# Patient Record
Sex: Female | Born: 1965 | Race: White | Marital: Single | Smoking: Never smoker
Health system: Southern US, Community
[De-identification: ages and names within clinical notes are randomized; demographics above are authoritative.]

## PROBLEM LIST (undated history)

## (undated) DIAGNOSIS — Z206 Contact with and (suspected) exposure to human immunodeficiency virus [HIV]: Secondary | ICD-10-CM

## (undated) DIAGNOSIS — T7840XA Allergy, unspecified, initial encounter: Secondary | ICD-10-CM

## (undated) DIAGNOSIS — K219 Gastro-esophageal reflux disease without esophagitis: Secondary | ICD-10-CM

## (undated) DIAGNOSIS — M1990 Unspecified osteoarthritis, unspecified site: Secondary | ICD-10-CM

## (undated) DIAGNOSIS — A6000 Herpesviral infection of urogenital system, unspecified: Secondary | ICD-10-CM

## (undated) HISTORY — PX: WISDOM TOOTH EXTRACTION: SHX21

## (undated) HISTORY — DX: Gastro-esophageal reflux disease without esophagitis: K21.9

## (undated) HISTORY — DX: Contact with and (suspected) exposure to human immunodeficiency virus (hiv): Z20.6

## (undated) HISTORY — DX: Unspecified osteoarthritis, unspecified site: M19.90

## (undated) HISTORY — PX: HIP SURGERY: SHX245

## (undated) HISTORY — PX: HEEL SPUR RESECTION: SHX6410

## (undated) HISTORY — PX: LASIK: SHX215

## (undated) HISTORY — PX: OTHER SURGICAL HISTORY: SHX169

## (undated) HISTORY — PX: KNEE ARTHROSCOPY: SUR90

## (undated) HISTORY — DX: Allergy, unspecified, initial encounter: T78.40XA

## (undated) HISTORY — DX: Herpesviral infection of urogenital system, unspecified: A60.00

---

## 1998-06-04 ENCOUNTER — Other Ambulatory Visit: Admission: RE | Admit: 1998-06-04 | Discharge: 1998-06-04 | Payer: Self-pay | Admitting: Obstetrics and Gynecology

## 1999-06-28 ENCOUNTER — Other Ambulatory Visit: Admission: RE | Admit: 1999-06-28 | Discharge: 1999-06-28 | Payer: Self-pay | Admitting: Obstetrics and Gynecology

## 1999-11-18 ENCOUNTER — Encounter: Payer: Self-pay | Admitting: Internal Medicine

## 1999-11-18 ENCOUNTER — Encounter: Admission: RE | Admit: 1999-11-18 | Discharge: 1999-11-18 | Payer: Self-pay | Admitting: Internal Medicine

## 2000-01-10 ENCOUNTER — Ambulatory Visit (HOSPITAL_COMMUNITY): Admission: RE | Admit: 2000-01-10 | Discharge: 2000-01-10 | Payer: Self-pay | Admitting: Orthopaedic Surgery

## 2000-06-28 ENCOUNTER — Other Ambulatory Visit: Admission: RE | Admit: 2000-06-28 | Discharge: 2000-06-28 | Payer: Self-pay | Admitting: Obstetrics and Gynecology

## 2001-06-12 ENCOUNTER — Ambulatory Visit (HOSPITAL_COMMUNITY): Admission: RE | Admit: 2001-06-12 | Discharge: 2001-06-12 | Payer: Self-pay | Admitting: Orthopaedic Surgery

## 2007-05-06 ENCOUNTER — Encounter: Admission: RE | Admit: 2007-05-06 | Discharge: 2007-05-06 | Payer: Self-pay | Admitting: Family Medicine

## 2008-04-16 LAB — CONVERTED CEMR LAB: Pap Smear: NORMAL

## 2008-05-14 ENCOUNTER — Ambulatory Visit (HOSPITAL_BASED_OUTPATIENT_CLINIC_OR_DEPARTMENT_OTHER): Admission: RE | Admit: 2008-05-14 | Discharge: 2008-05-14 | Payer: Self-pay | Admitting: Family Medicine

## 2009-01-06 ENCOUNTER — Ambulatory Visit: Payer: Self-pay | Admitting: Family Medicine

## 2009-01-06 ENCOUNTER — Encounter: Payer: Self-pay | Admitting: Family Medicine

## 2009-01-06 ENCOUNTER — Other Ambulatory Visit: Admission: RE | Admit: 2009-01-06 | Discharge: 2009-01-06 | Payer: Self-pay | Admitting: Family Medicine

## 2009-01-07 ENCOUNTER — Encounter: Payer: Self-pay | Admitting: Family Medicine

## 2009-01-07 DIAGNOSIS — R740 Nonspecific elevation of levels of transaminase and lactic acid dehydrogenase [LDH]: Secondary | ICD-10-CM

## 2009-01-07 LAB — CONVERTED CEMR LAB
ALT: 50 units/L — ABNORMAL HIGH (ref 0–35)
Alkaline Phosphatase: 90 units/L (ref 39–117)
BUN: 15 mg/dL (ref 6–23)
Chloride: 104 meq/L (ref 96–112)
Cholesterol: 166 mg/dL (ref 0–200)
Creatinine, Ser: 0.76 mg/dL (ref 0.40–1.20)
Glucose, Bld: 93 mg/dL (ref 70–99)
Hemoglobin: 14.6 g/dL (ref 12.0–15.0)
RBC: 4.6 M/uL (ref 3.87–5.11)
TSH: 1.754 microintl units/mL (ref 0.350–4.500)
Total Protein: 7.1 g/dL (ref 6.0–8.3)
Triglycerides: 38 mg/dL (ref <150)
VLDL: 8 mg/dL (ref 0–40)
WBC: 5.2 10*3/uL (ref 4.0–10.5)

## 2009-01-08 LAB — CONVERTED CEMR LAB: HCV Ab: NEGATIVE

## 2009-01-13 ENCOUNTER — Encounter: Payer: Self-pay | Admitting: Family Medicine

## 2009-01-14 LAB — CONVERTED CEMR LAB
ALT: 28 units/L (ref 0–35)
Albumin: 4.4 g/dL (ref 3.5–5.2)
Indirect Bilirubin: 0.5 mg/dL (ref 0.0–0.9)
Total Bilirubin: 0.7 mg/dL (ref 0.3–1.2)
Total Protein: 6.7 g/dL (ref 6.0–8.3)

## 2009-10-29 ENCOUNTER — Encounter: Admission: RE | Admit: 2009-10-29 | Discharge: 2009-11-26 | Payer: Self-pay | Admitting: Orthopaedic Surgery

## 2009-12-21 ENCOUNTER — Encounter: Admission: RE | Admit: 2009-12-21 | Discharge: 2009-12-21 | Payer: Self-pay | Admitting: Family Medicine

## 2010-03-22 ENCOUNTER — Ambulatory Visit: Payer: Self-pay | Admitting: Family Medicine

## 2010-03-22 DIAGNOSIS — H9209 Otalgia, unspecified ear: Secondary | ICD-10-CM | POA: Insufficient documentation

## 2010-06-03 ENCOUNTER — Ambulatory Visit: Payer: Self-pay | Admitting: Family Medicine

## 2010-06-03 DIAGNOSIS — K649 Unspecified hemorrhoids: Secondary | ICD-10-CM | POA: Insufficient documentation

## 2010-06-03 LAB — CONVERTED CEMR LAB
HDL goal, serum: 40 mg/dL
LDL Goal: 160 mg/dL

## 2010-06-05 LAB — CONVERTED CEMR LAB: TSH: 1.63 microintl units/mL (ref 0.350–4.500)

## 2010-08-18 NOTE — Assessment & Plan Note (Signed)
Summary: Otalgia   Vital Signs:  Patient profile:   45 year old female Menstrual status:  regular Height:      65 inches Weight:      158 pounds BMI:     26.39 Temp:     98.6 degrees F oral Pulse rate:   114 / minute BP sitting:   121 / 72  (left arm) Cuff size:   regular  Vitals Entered By: Avon Gully CMA, Duncan Dull) (March 22, 2010 2:05 PM) CC: Ear pain and dizziness   Primary Care G[facility]ent[facility]:  Nani Gasser, MD  CC:  Ear pain and dizziness.  History of Present Illness: Left ear pain for one week. Takes Allegra every other day for allergies. Last couple o fdays has felt dizzy when first gets up in the morning.  No true vertigo. No syncope or lightheadedness. No ear drianage. Some pressure. No fever.  No ST.  Sneezing and post nasal drip.    Current Medications (verified): 1)  Valacyclovir Hcl 500 Mg Tabs (Valacyclovir Hcl) .Marland Kitchen.. 1 Tab By Mouth Daily 2)  Omeprazole 20 Mg Cpdr (Omeprazole) .Marland Kitchen.. 1 Capsule By Mouth Every Other Day 3)  Fexofenadine Hcl 180 Mg Tabs (Fexofenadine Hcl) .Marland Kitchen.. 1 Tab By Mouth Every Other Day 4)  Alli  Allergies (verified): No Known Drug Allergies  Comments:  Nurse/Medical Assistant: The patient's medications and allergies were reviewed with the patient and were updated in the Medication and Allergy Lists. Avon Gully CMA, Duncan Dull) (March 22, 2010 2:06 PM)  Physical Exam  General:  Well-developed,well-nourished,in no acute distress; alert,appropriate and cooperative throughout examination Head:  Normocephalic and atraumatic without obvious abnormalities. No apparent alopecia or balding. Eyes:  No corneal or conjunctival inflammation noted. EOMI. Perrla. l. Ears:  External ear exam shows no significant lesions or deformities.  Otoscopic examination reveals clear canals, tympanic membranes are intact bilaterally without bulging, retraction, inflammation or discharge. Hearing is grossly normal bilaterally. Nose:  External nasal  examination shows no deformity or inflammation. Nasal mucosa are pink and moist without lesions or exudates. Mouth:  Oral mucosa and oropharynx without lesions or exudates.  Teeth in good repair. Neck:  No deformities, masses, or tenderness noted. Lungs:  Normal respiratory effort, chest expands symmetrically. Lungs are clear to auscultation, no crackles or wheezes. Heart:  Normal rate and regular rhythm. S1 and S2 normal without gallop, murmur, click, rub or other extra sounds. Skin:  no rashes.   Cervical Nodes:  No lymphadenopathy noted Psych:  Cognition and judgment appear intact. Alert and cooperative with normal attention span and concentration. No apparent delusions, illusions, hallucinations   Impression & Recommendations:  Problem # 1:  OTALGIA (ICD-388.70)  Discussed symptom control. Will increase her allergoy regimen.  Increase allegra to daily and start the asteprodialy. If not better in one week then let me know or if the dizziness Samples of astepro given. REviewed how to use it. Nasal steroid cause nosebleeds.   Complete Medication List: 1)  Valacyclovir Hcl 500 Mg Tabs (Valacyclovir hcl) .Marland Kitchen.. 1 tab by mouth daily 2)  Omeprazole 20 Mg Cpdr (Omeprazole) .Marland Kitchen.. 1 capsule by mouth every other day 3)  Fexofenadine Hcl 180 Mg Tabs (Fexofenadine hcl) .Marland Kitchen.. 1 tab by mouth every other day 4)  Alli   Patient Instructions: 1)  Trial of astepo - one spray in each nostril two times a day and increase Allegra to daily. 2)  If not better in one week then call please.

## 2010-08-18 NOTE — Assessment & Plan Note (Signed)
Summary:  Hemmoroids   Vital Signs:  Patient profile:   45 year old female Menstrual status:  regular Height:      65 inches Weight:      165 pounds Pulse rate:   71 / minute BP sitting:   128 / 75  (right arm) Cuff size:   regular  Vitals Entered By: Avon Gully CMA, Duncan Dull) (June 03, 2010 3:36 PM) CC: possible hemmrhoids, straining with BM and could see something in the rectum, Lipid Management   Primary Care Provider:  Nani Gasser, MD  CC:  possible hemmrhoids, straining with BM and could see something in the rectum, and Lipid Management.  History of Present Illness: possible hemmrhoids, straining with BM and could see something in the rectum.  No external pain or itching.  Still having one BM a day but does have to strain.  Gets BRB on the tissue.  Wehn bear down can see tissued. She has gained some weight but overall feels her diet is healhty with fruits and veggies. + family hx of thyroid d/o.   Lipid Management History:      Negative NCEP/ATP III risk factors include female age less than 55 years old, HDL cholesterol greater than 60, and non-tobacco-user status.    Current Medications (verified): 1)  Valacyclovir Hcl 500 Mg Tabs (Valacyclovir Hcl) .Marland Kitchen.. 1 Tab By Mouth Daily 2)  Omeprazole 20 Mg Cpdr (Omeprazole) .Marland Kitchen.. 1 Capsule By Mouth Every Other Day 3)  Fexofenadine Hcl 180 Mg Tabs (Fexofenadine Hcl) .Marland Kitchen.. 1 Tab By Mouth Every Other Day 4)  Alli  Allergies (verified): No Known Drug Allergies  Comments:  Nurse/Medical Assistant: The patient's medications and allergies were reviewed with the patient and were updated in the Medication and Allergy Lists. 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  BS in Biology.  Married to [employer] with one Museum/gallery conservator.  Husband has hx of prostate Cancer. First husband died, HIV.   Never Smoked Alcohol use-yes Drug use-no Regular exercise-yes, tennis 3 x a  week  Physical Exam  General:  Well-developed,well-nourished,in no acute distress; alert,appropriate and cooperative throughout examination Rectal:  Small noninflammed internal hemorrhoid at the 6 o'clock position. She has a small fissure at the 12 o'clock position.     Impression & Recommendations:  Problem # 1:  HEMORRHOIDS (ICD-455.6) Discussed dx. Dsicussed working on softening the stools by increasing her fiber. She feels she does a good job with her diet so recommen OTC fiber supplement. Call if any flares or pain.   Problem # 2:  CONSTIPATION (ICD-564.00) Dsicussed working on softening the stools by increasing her fiber. She feels she does a good job with her diet so recommen OTC fiber supplement. Can add a stool softener if getting too much gas. With her family hx  and recent change and weight gain I wll check her Thyroid level.   Orders: T-TSH ([id number])  Complete Medication List: 1)  Valacyclovir Hcl 500 Mg Tabs (Valacyclovir hcl) .Marland Kitchen.. 1 tab by mouth daily 2)  Omeprazole 20 Mg Cpdr (Omeprazole) .Marland Kitchen.. 1 capsule by mouth every other day 3)  Fexofenadine Hcl 180 Mg Tabs (Fexofenadine hcl) .Marland Kitchen.. 1 tab by mouth every other day 4)  Alli   Lipid Assessment/Plan:      Based on NCEP/ATP III, the patient's risk factor category is "0-1 risk factors".  The patient's lipid goals are as follows: Total cholesterol goal is 200; LDL cholesterol goal is 160; HDL  cholesterol goal is 40; Triglyceride goal is 150.     Contraindications/Deferment of Procedures/Staging:    Test/Procedure: FLU VAX    Reason for deferment: patient declined   Patient Instructions: 1)  INcrease the fiber in your diet wtih benefiber or Metamucil. If causing too much gas can add a stool softener like colace.  2)  Make sure drinking plenty of water.  3)  We will calll you with your lab result.    Orders Added: 1)  T-TSH [[id number]] 2)  Est. Patient Level III [78295]

## 2010-12-02 NOTE — Op Note (Signed)
[place]  Patient:    Andrea Tran, Andrea Tran Visit Number: [id number] MRN: [id number]          Service Type: DSU Location: [facility] 2550 03 Attending Physician:  Jacki Cones Dictated by:   Veverly Fells Ophelia Charter, M.D. Proc. Date: 06/12/01 Admit Date:  06/12/2001                             Operative Report  PREOPERATIVE DIAGNOSIS:  Left snapping hip.  POSTOPERATIVE DIAGNOSIS:  Left snapping hip.  PROCEDURE:  Left hip arthroscopy and debridement of anterior labral tear.  SURGEON:  Mark C. Ophelia Charter, M.D.  ASSISTANT:  Nadara Mustard, M.D.  ANESTHESIA:  General.  PROCEDURE:  After induction of general anesthesia, the patient was placed in the lateral position over a peroneal post with the right side down and the left foot in traction.  Careful padding, axillary roll, and positioning was performed.  DuraPrep was used.  The area was secured with towels, and a large Betadine Vi-Drape was applied.  The patient was then visualized under fluoroscopy.  The trochanter was marked with a sterile skin marker. Initially, a posterolateral portal was attempted.  Position looked good under fluoroscopy; however, there was poor visualization with the arthroscope. Inflow looked good.  The scope was dry, and some water was added to help with visualization, and this initially gave satisfactory images for a few seconds, and then again, there was poor visualization.  Outflow as established through an anterolateral portal, and there was still poor visualization.  Anterior portal was made after adding some additional traction staying lateral to the neurovascular bundle.  Arthroscope was introduced from the anterior portal and inspection of the scope showed that there was some scratching in the scope, and a new scope was obtained which held the water, and excellent visualization was obtained.  The fovea was visualized.  There was no chondromalacia of the head.  Inspection of the  anterior portion of the labrum showed some scar tissue present in the anterior labrum which did not visualize on the MR arthrogram.  There was a tear of the anterior labrum, and this was debrided back with the 4.2  _______ shaver switching from the anterolateral portal to the anterior portal and also the posterolateral portal.  Once this tissue was debrided, there was excellent visualization of the joint.  The hip was rotated, and no chondral lesions were seen on the head.  The joint was suctioned dry.  Marcaine had been added to all portals, and postop dressing was applied after nylon simple skin sutures were placed at the portals.  The patient tolerated the procedure well and was transferred to the recovery room and then home. Dictated by:   Veverly Fells Ophelia Charter, M.D. Attending Physician:  Jacki Cones DD:  06/13/01 TD:  06/13/01 Job: [id number]

## 2011-02-07 ENCOUNTER — Other Ambulatory Visit: Payer: Self-pay | Admitting: Family Medicine

## 2011-02-14 ENCOUNTER — Other Ambulatory Visit: Payer: Self-pay | Admitting: Family Medicine

## 2011-02-14 MED ORDER — VALACYCLOVIR HCL 500 MG PO TABS: 500.0000 mg | ORAL_TABLET | Freq: Every day | ORAL | Status: DC

## 2011-02-14 NOTE — Telephone Encounter (Signed)
Pt called  Inquiring about her valtrex script for genital herpes. She says the pharm is saying they do not have the script. Plan:  Reviewed the pt chart, and the script was sent on 02-07-11.  The pharm was correct in the chart.  When the pharmacist was called the script was obviously kicked out because it did not have directions.  Rx was re- sent with directions and the pt was informed. Jarvis Newcomer, LPN [facility]

## 2011-05-14 ENCOUNTER — Encounter: Payer: Self-pay | Admitting: Family Medicine

## 2011-05-19 ENCOUNTER — Encounter: Payer: Self-pay | Admitting: Family Medicine

## 2011-05-19 ENCOUNTER — Ambulatory Visit (INDEPENDENT_AMBULATORY_CARE_PROVIDER_SITE_OTHER): Payer: BC Managed Care – PPO | Admitting: Family Medicine

## 2011-05-19 ENCOUNTER — Other Ambulatory Visit (HOSPITAL_COMMUNITY)
Admission: RE | Admit: 2011-05-19 | Discharge: 2011-05-19 | Disposition: A | Discharge status: Home / Self Care | Payer: BC Managed Care – PPO | Source: Ambulatory Visit | Attending: Family Medicine | Admitting: Family Medicine

## 2011-05-19 VITALS — BP 116/76 | HR 69 | Wt 166.0 lb

## 2011-05-19 DIAGNOSIS — Z01419 Encounter for gynecological examination (general) (routine) without abnormal findings: Secondary | ICD-10-CM | POA: Insufficient documentation

## 2011-05-19 DIAGNOSIS — Z1159 Encounter for screening for other viral diseases: Secondary | ICD-10-CM | POA: Insufficient documentation

## 2011-05-19 MED ORDER — VALACYCLOVIR HCL 500 MG PO TABS: 500.0000 mg | ORAL_TABLET | Freq: Every day | ORAL | Status: DC

## 2011-05-19 NOTE — Progress Notes (Signed)
  Subjective:     Andrea Tran is a 45 y.o. female and is here for a comprehensive physical exam. The patient reports no problems.  History   Social History  . Marital Status: Married    Spouse Name: Ed    Number of Children: 0   . Years of Education: N/A   Occupational History  . Sales Assot    Social History Main Topics  . Smoking status: Never Smoker   . Smokeless tobacco: Not on file  . Alcohol Use: 0.0 - 0.5 oz/week    0-1 drink(s) per week  . Drug Use: No  . 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   Other Topics Concern  . Not on file   Social History Narrative   2 caffeine per day. Plays tennis 2-3 x per week.    Health Maintenance  Topic Date Due  . Influenza Vaccine  04/16/2012  . Pap Smear  05/18/2014  . Tetanus/tdap  01/07/2019    The following portions of the patient's history were reviewed and updated as appropriate: allergies, current medications, past family history, past medical history, past social history, past surgical history and problem list.  Review of Systems A comprehensive review of systems was negative.   Objective:    BP 116/76  Pulse 69  Wt 166 lb (75.297 kg)  LMP 05/06/2011 General appearance: alert, cooperative and appears stated age Head: Normocephalic, without obvious abnormality, atraumatic Eyes: conjunctiva clear, EOMi, PEERLA Ears: normal TM's and external ear canals both ears Nose: Nares normal. Septum midline. Mucosa normal. No drainage or sinus tenderness. Throat: lips, mucosa, and tongue normal; teeth and gums normal Neck: no adenopathy, no carotid bruit, supple, symmetrical, trachea midline and thyroid not enlarged, symmetric, no tenderness/mass/nodules Back: symmetric, no curvature. ROM normal. No CVA tenderness. Lungs: clear to auscultation bilaterally Breasts: normal appearance, no masses or tenderness Heart:  regular rate and rhythm, S1, S2 normal, no murmur, click, rub or gallop Abdomen: soft, non-tender; bowel sounds normal; no masses,  no organomegaly Pelvic: cervix normal in appearance, external genitalia normal, no adnexal masses or tenderness, no cervical motion tenderness, rectovaginal septum normal, uterus normal size, shape, and consistency and vagina normal without discharge Extremities: extremities normal, atraumatic, no cyanosis or edema Pulses: 2+ and symmetric Skin: Skin color, texture, turgor normal. No rashes or lesions Lymph nodes: Cervical, supraclavicular, and axillary nodes normal. Neurologic: Grossly normal    Assessment:    Healthy female exam.  Plan:     See After Visit Summary for Counseling Recommendations  She plans to schedule her mammogram Keep up the regular exercise program and make sure you are eating a healthy diet Try to eat 4 servings of dairy a day or take a calcium supplement (500mg  twice a day). Your vaccines are up to date.  Labs last year were normal. Repeat again next year.   Will call with pap results.  Strong fam hx of thyroid dz but she doesn't want testing this year. Her periods have been a little heavier this year, still regular. She is thinking about birth control. If wants to do this just call the office. I did discuss inc risk of blood clots since over age 47.

## 2011-05-19 NOTE — Patient Instructions (Signed)
She plans to schedule her mammogram Keep up the regular exercise program and make sure you are eating a healthy diet Try to eat 4 servings of dairy a day or take a calcium supplement (500mg  twice a day). Your vaccines are up to date.

## 2011-08-18 ENCOUNTER — Other Ambulatory Visit: Payer: Self-pay | Admitting: Family Medicine

## 2012-01-26 ENCOUNTER — Ambulatory Visit: Payer: BC Managed Care – PPO | Admitting: Physical Therapy

## 2012-01-29 ENCOUNTER — Ambulatory Visit: Payer: BC Managed Care – PPO | Attending: Podiatry | Admitting: Physical Therapy

## 2012-01-29 DIAGNOSIS — IMO0001 Reserved for inherently not codable concepts without codable children: Secondary | ICD-10-CM | POA: Insufficient documentation

## 2012-01-29 DIAGNOSIS — R269 Unspecified abnormalities of gait and mobility: Secondary | ICD-10-CM | POA: Insufficient documentation

## 2012-11-20 ENCOUNTER — Other Ambulatory Visit: Payer: Self-pay | Admitting: Family Medicine

## 2012-11-20 MED ORDER — VALACYCLOVIR HCL 500 MG PO TABS: 500.0000 mg | ORAL_TABLET | Freq: Every day | ORAL | Status: DC

## 2013-01-24 ENCOUNTER — Other Ambulatory Visit: Payer: Self-pay | Admitting: *Deleted

## 2013-01-24 DIAGNOSIS — Z0000 Encounter for general adult medical examination without abnormal findings: Secondary | ICD-10-CM

## 2013-02-03 ENCOUNTER — Other Ambulatory Visit: Payer: Self-pay | Admitting: Family Medicine

## 2013-02-03 LAB — LIPID PANEL
LDL Cholesterol: 75 mg/dL (ref 0–99)
Total CHOL/HDL Ratio: 2.4 Ratio
Triglycerides: 37 mg/dL (ref <150)

## 2013-02-03 LAB — COMPLETE METABOLIC PANEL WITH GFR
ALT: 13 U/L (ref 0–35)
AST: 14 U/L (ref 0–37)
Alkaline Phosphatase: 71 U/L (ref 39–117)
BUN: 13 mg/dL (ref 6–23)
CO2: 29 mEq/L (ref 19–32)
Calcium: 9.3 mg/dL (ref 8.4–10.5)
GFR, Est African American: >89 mL/min
GFR, Est Non African American: >89 mL/min
Glucose, Bld: 96 mg/dL (ref 70–99)
Total Bilirubin: 0.4 mg/dL (ref 0.3–1.2)

## 2013-02-03 LAB — TSH: TSH: 2.341 u[IU]/mL (ref 0.350–4.500)

## 2013-02-10 ENCOUNTER — Ambulatory Visit (INDEPENDENT_AMBULATORY_CARE_PROVIDER_SITE_OTHER): Payer: BC Managed Care – PPO | Admitting: Family Medicine

## 2013-02-10 ENCOUNTER — Encounter: Payer: Self-pay | Admitting: Family Medicine

## 2013-02-10 VITALS — BP 108/67 | HR 71 | Wt 152.0 lb

## 2013-02-10 DIAGNOSIS — Z0000 Encounter for general adult medical examination without abnormal findings: Secondary | ICD-10-CM

## 2013-02-10 DIAGNOSIS — A6000 Herpesviral infection of urogenital system, unspecified: Secondary | ICD-10-CM | POA: Insufficient documentation

## 2013-02-10 MED ORDER — VALACYCLOVIR HCL 500 MG PO TABS: 500.0000 mg | ORAL_TABLET | Freq: Every day | ORAL | Status: DC

## 2013-02-10 NOTE — Progress Notes (Signed)
  Subjective:     Andrea Tran is a 47 y.o. female and is here for a comprehensive physical exam. The patient reports no problems.  History   Social History  . Marital Status: Married    Spouse Name: [name]    Number of Children: 0   . Years of Education: N/A   Occupational History  . Sales Assot    Social History Main Topics  . Smoking status: Never Smoker   . Smokeless tobacco: Not on file  . Alcohol Use: 0 - .5 oz/week    0-1 drink(s) per week  . Drug Use: No  . 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   Other Topics Concern  . Not on file   Social History Narrative   2 caffeine per day. Plays tennis 2-3 x per week.    Health Maintenance  Topic Date Due  . Influenza Vaccine  03/17/2013  . Pap Smear  05/18/2014  . Tetanus/tdap  01/07/2019    The following portions of the patient's history were reviewed and updated as appropriate: allergies, current medications, past family history, past medical history, past social history, past surgical history and problem list.  Review of Systems A comprehensive review of systems was negative.   Objective:    BP 108/67  Pulse 71  Wt 152 lb (68.947 kg)  BMI 25.29 kg/m2 General appearance: alert, cooperative and appears stated age Head: Normocephalic, without obvious abnormality, atraumatic Eyes: conj clear, EOMi, PEERLA Ears: normal TM's and external ear canals both ears Nose: Nares normal. Septum midline. Mucosa normal. No drainage or sinus tenderness. Throat: lips, mucosa, and tongue normal; teeth and gums normal Neck: no adenopathy, no carotid bruit, no JVD, supple, symmetrical, trachea midline and thyroid not enlarged, symmetric, no tenderness/mass/nodules Back: symmetric, no curvature. ROM normal. No CVA tenderness. Lungs: clear to auscultation bilaterally Breasts: normal appearance, no masses or  tenderness Heart: regular rate and rhythm, S1, S2 normal, no murmur, click, rub or gallop Abdomen: soft, non-tender; bowel sounds normal; no masses,  no organomegaly Extremities: extremities normal, atraumatic, no cyanosis or edema Pulses: 2+ and symmetric Skin: Skin color, texture, turgor normal. No rashes or lesions Lymph nodes: Cervical, supraclavicular, and axillary nodes normal. Neurologic: Alert and oriented X 3, normal strength and tone. Normal symmetric reflexes. Normal coordination and gait    Assessment:    Healthy female exam.      Plan:     See After Visit Summary for Counseling Recommendations  Keep up a regular exercise program and make sure you are eating a healthy diet Try to eat 4 servings of dairy a day, or if you are lactose intolerant take a calcium with vitamin D daily.  Your vaccines are up to date.    Refill valtrex.

## 2014-02-02 ENCOUNTER — Other Ambulatory Visit: Payer: Self-pay

## 2014-02-02 ENCOUNTER — Other Ambulatory Visit: Payer: Self-pay | Admitting: Family Medicine

## 2014-02-02 MED ORDER — VALACYCLOVIR HCL 500 MG PO TABS: 500.0000 mg | ORAL_TABLET | Freq: Every day | ORAL | Status: DC

## 2014-02-26 ENCOUNTER — Ambulatory Visit (INDEPENDENT_AMBULATORY_CARE_PROVIDER_SITE_OTHER): Payer: BC Managed Care – PPO | Admitting: Family Medicine

## 2014-02-26 ENCOUNTER — Ambulatory Visit (INDEPENDENT_AMBULATORY_CARE_PROVIDER_SITE_OTHER): Payer: BC Managed Care – PPO

## 2014-02-26 ENCOUNTER — Encounter: Payer: Self-pay | Admitting: Family Medicine

## 2014-02-26 ENCOUNTER — Other Ambulatory Visit (HOSPITAL_COMMUNITY)
Admission: RE | Admit: 2014-02-26 | Discharge: 2014-02-26 | Disposition: A | Discharge status: Home / Self Care | Payer: BC Managed Care – PPO | Source: Ambulatory Visit | Attending: Family Medicine | Admitting: Family Medicine

## 2014-02-26 VITALS — BP 109/70 | HR 71 | Ht 65.0 in | Wt 169.0 lb

## 2014-02-26 DIAGNOSIS — Z0000 Encounter for general adult medical examination without abnormal findings: Secondary | ICD-10-CM | POA: Diagnosis not present

## 2014-02-26 DIAGNOSIS — M79609 Pain in unspecified limb: Secondary | ICD-10-CM

## 2014-02-26 DIAGNOSIS — Z202 Contact with and (suspected) exposure to infections with a predominantly sexual mode of transmission: Secondary | ICD-10-CM

## 2014-02-26 DIAGNOSIS — M79672 Pain in left foot: Secondary | ICD-10-CM

## 2014-02-26 DIAGNOSIS — L821 Other seborrheic keratosis: Secondary | ICD-10-CM | POA: Diagnosis not present

## 2014-02-26 DIAGNOSIS — Z01419 Encounter for gynecological examination (general) (routine) without abnormal findings: Secondary | ICD-10-CM | POA: Insufficient documentation

## 2014-02-26 DIAGNOSIS — Z1231 Encounter for screening mammogram for malignant neoplasm of breast: Secondary | ICD-10-CM

## 2014-02-26 DIAGNOSIS — L659 Nonscarring hair loss, unspecified: Secondary | ICD-10-CM

## 2014-02-26 DIAGNOSIS — Z124 Encounter for screening for malignant neoplasm of cervix: Secondary | ICD-10-CM

## 2014-02-26 LAB — COMPLETE METABOLIC PANEL WITH GFR
ALBUMIN: 4 g/dL (ref 3.5–5.2)
ALK PHOS: 69 U/L (ref 39–117)
ALT: 13 U/L (ref 0–35)
AST: 13 U/L (ref 0–37)
BUN: 13 mg/dL (ref 6–23)
CHLORIDE: 104 meq/L (ref 96–112)
CO2: 25 meq/L (ref 19–32)
Calcium: 8.7 mg/dL (ref 8.4–10.5)
Creat: 0.7 mg/dL (ref 0.50–1.10)
GFR, Est African American: >89 mL/min
GLUCOSE: 88 mg/dL (ref 70–99)
Potassium: 4.3 mEq/L (ref 3.5–5.3)
SODIUM: 137 meq/L (ref 135–145)
TOTAL PROTEIN: 6.2 g/dL (ref 6.0–8.3)
Total Bilirubin: 0.8 mg/dL (ref 0.2–1.2)

## 2014-02-26 LAB — LIPID PANEL
CHOL/HDL RATIO: 2.7 ratio
CHOLESTEROL: 157 mg/dL (ref 0–200)
HDL: 59 mg/dL (ref >39)
LDL Cholesterol: 90 mg/dL (ref 0–99)
TRIGLYCERIDES: 39 mg/dL (ref <150)
VLDL: 8 mg/dL (ref 0–40)

## 2014-02-26 MED ORDER — VALACYCLOVIR HCL 500 MG PO TABS: ORAL_TABLET | ORAL | Status: DC

## 2014-02-26 NOTE — Progress Notes (Signed)
Subjective:     Andrea Tran is a 48 y.o. female and is here for a comprehensive physical exam. The patient reports problems - Pain on the bottom of the lef foot x 3 weeks.  over ball of the foot. no trauma or injury. Using aleve occ for relief and helps some.  plays tennis. Had a sterss freacture years ago.. She also complains of hair loss. She's as of the last couple months she's been noticing she's having a lot more hair coming out in the shower and when she just wants her fingers to her hair. She otherwise feels like her hair is healthy as is coming out more. She does not use any type of hair dyes or relaxing products. The hair loss is generalized and not a discreet spots were locations. She denies any scalp problems such as itchiness etc.  She also has a couple of lesions on her left shin. She would like them frozen off today as they're catching on her razor and bleeding intermittently.  History   Social History  . Marital Status: Married    Spouse Name: Ed    Number of Children: 0   . Years of Education: N/A   Occupational History  . Sales Assot    Social History Main Topics  . Smoking status: Never Smoker   . Smokeless tobacco: Not on file  . Alcohol Use: 0.0 - 0.5 oz/week    0-1 drink(s) per week  . Drug Use: No  . 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   Other Topics Concern  . Not on file   Social History Narrative   1 caffeine per day. Plays tennis 2-3 x per week. On step duaghter   Health Maintenance  Topic Date Due  . Influenza Vaccine  02/14/2014  . Pap Smear  05/18/2014  . Tetanus/tdap  01/07/2019    The following portions of the patient's history were reviewed and updated as appropriate: allergies, current medications, past family history, past medical history, past social history, past surgical history and problem list.  Review of Systems A  comprehensive review of systems was negative.   Objective:    BP 109/70  Pulse 71  Ht 5\' 5"  (1.651 m)  Wt 169 lb (76.658 kg)  BMI 28.12 kg/m2 General appearance: alert, cooperative and appears stated age Head: Normocephalic, without obvious abnormality, atraumatic Eyes: conj clear, EOMi, PEERLA Ears: normal TM's and external ear canals both ears Nose: Nares normal. Septum midline. Mucosa normal. No drainage or sinus tenderness. Throat: lips, mucosa, and tongue normal; teeth and gums normal Neck: no adenopathy, no carotid bruit, no JVD, supple, symmetrical, trachea midline and thyroid not enlarged, symmetric, no tenderness/mass/nodules Back: symmetric, no curvature. ROM normal. No CVA tenderness. Lungs: clear to auscultation bilaterally Breasts: normal appearance, no masses or tenderness Heart: regular rate and rhythm, S1, S2 normal, no murmur, click, rub or gallop Abdomen: soft, non-tender; bowel sounds normal; no masses,  no organomegaly Pelvic: external genitalia normal, no adnexal masses or tenderness, no cervical motion tenderness, rectovaginal septum normal, uterus normal size, shape, and consistency, vagina normal without discharge and cervix easily friable with mucocele at 4 o'clock Extremities: extremities normal, atraumatic, no cyanosis or edema Pulses: 2+ and symmetric Skin: Skin color, texture, turgor normal. No rashes or lesions . She has 3 seborrheic keratosis on the left shin. Lymph nodes: Cervical, supraclavicular, and axillary  nodes normal. Neurologic: Alert and oriented X 3, normal strength and tone. Normal symmetric reflexes. Normal coordination and gait    Assessment:    Healthy female exam.      Plan:     See After Visit Summary for Counseling Recommendations  Keep up a regular exercise program and make sure you are eating a healthy diet Try to eat 4 servings of dairy a day, or if you are lactose intolerant take a calcium with vitamin D daily.  Your vaccines  are up to date.  Call with Pap smear results once available.  Hair loss - unclear etiology. It may just be part of normal shedding. will check for deficiency  B12, iron, folate. We'll also check thyroid level.  Recommend screening HIV test. Her first husband was positive for HIV.  Left foot pain-will get an x-ray to evaluate for stress fracture. The pain seems to be directly over the distal metatarsal most at the joint line on the fourth metatarsal and the left foot. Can continue to use Aleve as needed for now. If she does have a stress fracture then consider placing her in a postop shoe.  Cryotherapy Procedure Note  Pre-operative Diagnosis: Seborrheic keratoses  Post-operative Diagnosis: Same  Locations: left shin  Indications: bleeding   Anesthesia: not required    Procedure Details  Patient informed of risks (permanent scarring, infection, light or dark discoloration, bleeding, infection, weakness, numbness and recurrence of the lesion) and benefits of the procedure and verbal informed consent obtained.  The areas are treated with liquid nitrogen therapy, frozen until ice ball extended 1 mm beyond lesion, allowed to thaw, and treated again. The patient tolerated procedure well.  The patient was instructed on post-op care, warned that there may be blister formation, redness and pain. Recommend OTC analgesia as needed for pain.  Condition: Stable  Complications: none.  Plan: 1. Instructed to keep the area dry and covered for 24-48h and clean thereafter. 2. Warning signs of infection were reviewed.   3. Recommended that the patient use OTC acetaminophen as needed for pain.  4. Return PRN.

## 2014-02-27 LAB — HIV ANTIBODY (ROUTINE TESTING W REFLEX): HIV: NONREACTIVE

## 2014-02-27 LAB — VITAMIN B12: Vitamin B-12: 511 pg/mL (ref 211–911)

## 2014-02-27 LAB — TSH: TSH: 1.566 u[IU]/mL (ref 0.350–4.500)

## 2014-02-27 LAB — FERRITIN: FERRITIN: 41 ng/mL (ref 10–291)

## 2014-02-27 LAB — FOLATE: Folate: 15.7 ng/mL

## 2014-03-02 LAB — CYTOLOGY - PAP

## 2014-03-02 NOTE — Progress Notes (Signed)
Quick Note:  Call patient: Your Pap smear is normal. Repeat in 2-3 years. ______ 

## 2014-12-04 ENCOUNTER — Other Ambulatory Visit: Payer: Self-pay | Admitting: Family Medicine

## 2015-04-09 ENCOUNTER — Other Ambulatory Visit: Payer: Self-pay

## 2015-04-09 DIAGNOSIS — Z1231 Encounter for screening mammogram for malignant neoplasm of breast: Secondary | ICD-10-CM

## 2015-05-06 ENCOUNTER — Encounter: Payer: Self-pay | Admitting: Family Medicine

## 2015-05-06 ENCOUNTER — Ambulatory Visit
Admission: RE | Admit: 2015-05-06 | Discharge: 2015-05-06 | Disposition: A | Discharge status: Home / Self Care | Payer: BLUE CROSS/BLUE SHIELD | Source: Ambulatory Visit

## 2015-05-06 DIAGNOSIS — Z1231 Encounter for screening mammogram for malignant neoplasm of breast: Secondary | ICD-10-CM

## 2015-05-17 ENCOUNTER — Ambulatory Visit (INDEPENDENT_AMBULATORY_CARE_PROVIDER_SITE_OTHER): Payer: BLUE CROSS/BLUE SHIELD | Admitting: Family Medicine

## 2015-05-17 ENCOUNTER — Encounter: Payer: Self-pay | Admitting: Family Medicine

## 2015-05-17 VITALS — BP 111/55 | HR 69 | Ht 65.0 in | Wt 179.4 lb

## 2015-05-17 DIAGNOSIS — Z0000 Encounter for general adult medical examination without abnormal findings: Secondary | ICD-10-CM

## 2015-05-17 DIAGNOSIS — L821 Other seborrheic keratosis: Secondary | ICD-10-CM

## 2015-05-17 DIAGNOSIS — D492 Neoplasm of unspecified behavior of bone, soft tissue, and skin: Secondary | ICD-10-CM | POA: Diagnosis not present

## 2015-05-17 DIAGNOSIS — Z3009 Encounter for other general counseling and advice on contraception: Secondary | ICD-10-CM

## 2015-05-17 DIAGNOSIS — Z309 Encounter for contraceptive management, unspecified: Secondary | ICD-10-CM

## 2015-05-17 LAB — COMPLETE METABOLIC PANEL WITH GFR
ALBUMIN: 4.3 g/dL (ref 3.6–5.1)
ALK PHOS: 80 U/L (ref 33–115)
ALT: 23 U/L (ref 6–29)
AST: 20 U/L (ref 10–35)
BILIRUBIN TOTAL: 0.7 mg/dL (ref 0.2–1.2)
BUN: 12 mg/dL (ref 7–25)
CO2: 29 mmol/L (ref 20–31)
Calcium: 9 mg/dL (ref 8.6–10.2)
Chloride: 104 mmol/L (ref 98–110)
Creat: 0.78 mg/dL (ref 0.50–1.10)
GFR, Est African American: >89 mL/min (ref >=60)
GFR, Est Non African American: >89 mL/min (ref >=60)
GLUCOSE: 92 mg/dL (ref 65–99)
Potassium: 4.2 mmol/L (ref 3.5–5.3)
SODIUM: 139 mmol/L (ref 135–146)
TOTAL PROTEIN: 6.8 g/dL (ref 6.1–8.1)

## 2015-05-17 LAB — LIPID PANEL
CHOL/HDL RATIO: 2.9 ratio (ref <=5.0)
Cholesterol: 197 mg/dL (ref 125–200)
HDL: 68 mg/dL (ref >=46)
LDL CALC: 115 mg/dL (ref <130)
Triglycerides: 71 mg/dL (ref <150)
VLDL: 14 mg/dL (ref <30)

## 2015-05-17 LAB — TSH: TSH: 1.594 u[IU]/mL (ref 0.350–4.500)

## 2015-05-17 MED ORDER — NORGESTIMATE-ETH ESTRADIOL 0.25-35 MG-MCG PO TABS
1.0000 | ORAL_TABLET | Freq: Every day | ORAL | Status: DC
Start: 2015-05-17 — End: 2016-05-08

## 2015-05-17 MED ORDER — IMIQUIMOD 5 % EX CREA: TOPICAL_CREAM | Freq: Every day | CUTANEOUS | Status: DC

## 2015-05-17 MED ORDER — VALACYCLOVIR HCL 500 MG PO TABS: ORAL_TABLET | ORAL | Status: DC

## 2015-05-17 NOTE — Patient Instructions (Signed)
If the skin is becoming really irritated can decrease cream to every other day.

## 2015-05-17 NOTE — Progress Notes (Signed)
Subjective:     Andrea Tran is a 49 y.o. female and is here for a comprehensive physical exam. The patient reports no problems.  Has 3 moles she wants to look at today. One on her left knee, left dorsum of hand and one on right facial cheek.   She also reports that she separated from her husband and would like to discuss some form of birth control. She's also considering trying to get pregnant in the next year even though she is 56.  Had her mammogram  Social History   Social History  . Marital Status: Married    Spouse Name: Ed  . Number of Children: 0   . Years of Education: N/A   Occupational History  . Sales Assot    Social History Main Topics  . Smoking status: Never Smoker   . Smokeless tobacco: Not on file  . Alcohol Use: 0.0 - 0.5 oz/week    0-1 drink(s) per week  . Drug Use: No  . Sexual Activity:    Partners: Male     Comment: sales asst [name], BS biology, married, one Psychiatrist, first husband deceased, reg exercise.   Other Topics Concern  . Not on file   Social History Narrative   1 caffeine per day. Plays tennis 2-3 x per week. On step duaghter   Health Maintenance  Topic Date Due  . INFLUENZA VACCINE  02/15/2015  . PAP SMEAR  02/26/2017  . TETANUS/TDAP  01/07/2019  . HIV Screening  Completed    The following portions of the patient's history were reviewed and updated as appropriate: allergies, current medications, past family history, past medical history, past social history, past surgical history and problem list.  Review of Systems Pertinent items noted in HPI and remainder of comprehensive ROS otherwise negative.   Objective:    There were no vitals taken for this visit. General appearance: alert, cooperative and appears stated age Head: Normocephalic, without obvious abnormality, atraumatic Eyes: conj clear, EOMI, PEERLA Ears: normal TM's and external ear canals both ears Nose: Nares normal. Septum midline. Mucosa normal. No  drainage or sinus tenderness. Throat: lips, mucosa, and tongue normal; teeth and gums normal Neck: no adenopathy, no carotid bruit, no JVD, supple, symmetrical, trachea midline and thyroid not enlarged, symmetric, no tenderness/mass/nodules Back: symmetric, no curvature. ROM normal. No CVA tenderness. Lungs: clear to auscultation bilaterally Heart: regular rate and rhythm, S1, S2 normal, no murmur, click, rub or gallop Abdomen: soft, non-tender; bowel sounds normal; no masses,  no organomegaly Extremities: extremities normal, atraumatic, no cyanosis or edema Pulses: 2+ and symmetric Skin: Skin color, texture, turgor normal. No rashes or lesions Lymph nodes: Cervical, supraclavicular, and axillary nodes normal. Neurologic: Alert and oriented X 3, normal strength and tone. Normal symmetric reflexes. Normal coordination and gait    She has a small seborrheic keratosis on the left knee and left dorsum of the hand. On the right facial cheek near the ear she has an approximately 2-3 mm raised pink papular somewhat shiny lesion. No scale.   Assessment:  CPE -   Keep up a regular exercise program and make sure you are eating a healthy diet Try to eat 4 servings of dairy a day, or if you are lactose intolerant take a calcium with vitamin D daily.  Your vaccines are up to date.  Declined flu shot.   mammogram is UTD.   Contraceptive counseling - will start OCPS, after discussing different options.  One about potential side  effects including increased risk for blood clots.  Seborrheic keratoses on the left knee and left dorsum of hand-gave reassurance of these are benign. Next  Lesion on the right side of the face is concerning for a possible basal cell skin cancer that is superficial. Will treat with a medical for 8 weeks and her have her follow-up in about 10 weeks to recheck the area to see if it may need to be biopsied.

## 2015-05-18 ENCOUNTER — Encounter: Payer: Self-pay | Admitting: Family Medicine

## 2015-05-19 ENCOUNTER — Telehealth: Payer: Self-pay | Admitting: Family Medicine

## 2015-05-19 ENCOUNTER — Encounter: Payer: Self-pay | Admitting: Family Medicine

## 2015-05-19 NOTE — Telephone Encounter (Signed)
Received fax for prior authorization on Imiquimod sent through cover my meds waiting on authorization. - CF

## 2015-05-27 ENCOUNTER — Telehealth: Payer: Self-pay | Admitting: *Deleted

## 2015-05-27 NOTE — Telephone Encounter (Signed)
Pt informed that she should come in to have the place on her face bx. She agreed to this and was transferred to scheduling to make an appt.Andrea Tran

## 2015-05-28 ENCOUNTER — Telehealth: Payer: Self-pay | Admitting: Family Medicine

## 2015-05-28 NOTE — Telephone Encounter (Signed)
Medication was denied. - CF 

## 2015-05-28 NOTE — Telephone Encounter (Signed)
I resent prior authorization for Imiquimod through cover my meds and received an approval from 05/28/2015 - 09/24/2015. - CF

## 2015-06-07 ENCOUNTER — Encounter: Payer: Self-pay | Admitting: Family Medicine

## 2015-06-07 ENCOUNTER — Ambulatory Visit (INDEPENDENT_AMBULATORY_CARE_PROVIDER_SITE_OTHER): Payer: BLUE CROSS/BLUE SHIELD | Admitting: Family Medicine

## 2015-06-07 VITALS — BP 122/59 | HR 68 | Temp 98.6°F | Resp 16 | Wt 183.7 lb

## 2015-06-07 DIAGNOSIS — L989 Disorder of the skin and subcutaneous tissue, unspecified: Secondary | ICD-10-CM

## 2015-06-07 NOTE — Addendum Note (Signed)
Addended by: Elizabeth Sauer on: 06/07/2015 11:24 AM   Modules accepted: Orders

## 2015-06-07 NOTE — Patient Instructions (Addendum)
Keep wound covered until tomorrow morning if possible. If you do need to change the bandage sooner that's perfectly fine. Okay to just clean with soap and water with fingertips. Do not scrub the area. Pat dry and apply Vaseline 2-3 times per day. Do not need to cover but can if would like to.  We will call you with the biopsy report once available.

## 2015-06-07 NOTE — Progress Notes (Signed)
   Subjective:    Patient ID: Andrea Tran, female    DOB: June 23, 1966, 49 y.o.   MRN: [id number]  HPI Patient come in complaining of a small lesion on the right facial cheek near the sideburn area. We try to call in a prescription for medical matters it was quite suspicious for possibly an early superficial basal cell. It does have a shiny appearance to it. Unfortunately it was denied by her insurance if she's coming in today for biopsy for further treatment and evaluation.   Review of Systems     Objective:   Physical Exam  Skin:  2-3 mm pink shiny papular lesion near the right ear near the side burn area.           Assessment & Plan:  Suspicious skin lesion.   Shave Biopsy Procedure Note  Pre-operative Diagnosis: Suspicious lesion  Post-operative Diagnosis: same  Locations:right facial cheeck near side burn area  Indications: new lesion, suspicious for basal cell skin cancer  Anesthesia: Lidocaine 1% with epinephrine without added sodium bicarbonate  Procedure Details  History of allergy to iodine: no  Patient informed of the risks (including bleeding and infection) and benefits of the  procedure and Verbal informed consent obtained.  The lesion and surrounding area were given a sterile prep using chlorhexidine and draped in the usual sterile fashion. A scalpel was used to shave an area of skin approximately 0.8cm by 0.8cm.  Hemostasis achieved with alumuninum chloride. Antibiotic ointment and a sterile dressing applied.  The specimen was sent for pathologic examination. The patient tolerated the procedure well.  EBL: 0 ml  Findings: Await pathology   Condition: Stable  Complications: none.  Plan: 1. Instructed to keep the wound dry and covered for 24-48h and clean thereafter. 2. Warning signs of infection were reviewed.   3. Recommended that the patient use OTC acetaminophen as needed for pain.  4. Return PRN.

## 2015-07-27 ENCOUNTER — Ambulatory Visit: Payer: BLUE CROSS/BLUE SHIELD | Admitting: Family Medicine

## 2015-11-21 ENCOUNTER — Other Ambulatory Visit: Payer: Self-pay | Admitting: Family Medicine

## 2016-02-23 ENCOUNTER — Other Ambulatory Visit: Payer: Self-pay | Admitting: Family Medicine

## 2016-05-08 ENCOUNTER — Other Ambulatory Visit: Payer: Self-pay | Admitting: Family Medicine

## 2016-05-22 ENCOUNTER — Encounter: Payer: Self-pay | Admitting: Family Medicine

## 2016-05-22 ENCOUNTER — Ambulatory Visit (INDEPENDENT_AMBULATORY_CARE_PROVIDER_SITE_OTHER): Payer: BLUE CROSS/BLUE SHIELD | Admitting: Family Medicine

## 2016-05-22 VITALS — BP 128/67 | HR 77 | Ht 65.0 in | Wt 178.0 lb

## 2016-05-22 DIAGNOSIS — Z0000 Encounter for general adult medical examination without abnormal findings: Secondary | ICD-10-CM

## 2016-05-22 LAB — COMPLETE METABOLIC PANEL WITH GFR
ALT: 21 U/L (ref 6–29)
AST: 22 U/L (ref 10–35)
Albumin: 3.9 g/dL (ref 3.6–5.1)
Alkaline Phosphatase: 57 U/L (ref 33–115)
BUN: 11 mg/dL (ref 7–25)
CHLORIDE: 104 mmol/L (ref 98–110)
CO2: 25 mmol/L (ref 20–31)
Calcium: 8.7 mg/dL (ref 8.6–10.2)
Creat: 0.69 mg/dL (ref 0.50–1.10)
Glucose, Bld: 82 mg/dL (ref 65–99)
POTASSIUM: 3.9 mmol/L (ref 3.5–5.3)
Sodium: 138 mmol/L (ref 135–146)
Total Bilirubin: 0.5 mg/dL (ref 0.2–1.2)
Total Protein: 6.3 g/dL (ref 6.1–8.1)

## 2016-05-22 LAB — LIPID PANEL
CHOL/HDL RATIO: 3.1 ratio (ref <5.0)
Cholesterol: 184 mg/dL (ref <200)
HDL: 59 mg/dL (ref >50)
LDL CALC: 105 mg/dL — AB
TRIGLYCERIDES: 102 mg/dL (ref <150)
VLDL: 20 mg/dL (ref <30)

## 2016-05-22 LAB — TSH: TSH: 1.64 mIU/L

## 2016-05-22 MED ORDER — NORGESTIMATE-ETH ESTRADIOL 0.25-35 MG-MCG PO TABS: 1.0000 | ORAL_TABLET | Freq: Every day | ORAL | 4 refills | Status: DC

## 2016-05-22 MED ORDER — VALACYCLOVIR HCL 500 MG PO TABS: 500.0000 mg | ORAL_TABLET | Freq: Every day | ORAL | 3 refills | Status: DC

## 2016-05-22 NOTE — Patient Instructions (Signed)
Keep up a regular exercise program and make sure you are eating a healthy diet Try to eat 4 servings of dairy a day, or if you are lactose intolerant take a calcium with vitamin D daily.  Your vaccines are up to date.   

## 2016-05-22 NOTE — Progress Notes (Signed)
   Subjective:     Andrea Tran is a 50 y.o. female and is here for a comprehensive physical exam. The patient reports problems - left knee pain.  Social History   Social History  . Marital status: Married    Spouse name: Ed  . Number of children: 0   . Years of education: N/A   Occupational History  . Sales Assot    Social History Main Topics  . Smoking status: Never Smoker  . Smokeless tobacco: Not on file  . Alcohol use 0.0 - 0.5 oz/week    0 - 1 drink(s) per week  . Drug use: No  . 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   Other Topics Concern  . Not on file   Social History Narrative   1 caffeine per day. Plays tennis 2-3 x per week. On step duaghter   Health Maintenance  Topic Date Due  . INFLUENZA VACCINE  10/15/2018 (Originally 02/15/2016)  . PAP SMEAR  02/26/2017  . TETANUS/TDAP  01/07/2019  . HIV Screening  Completed    The following portions of the patient's history were reviewed and updated as appropriate: allergies, current medications, past family history, past medical history, past social history, past surgical history and problem list.  Review of Systems A comprehensive review of systems was negative.   Objective:    BP 128/67   Pulse 77   Ht 5\' 5"  (1.651 m)   Wt 178 lb (80.7 kg)   BMI 29.62 kg/m  General appearance: alert, cooperative and appears stated age Head: Normocephalic, without obvious abnormality, atraumatic Eyes: conj clear, EOMI, PEERLA Ears: normal TM's and external ear canals both ears Nose: Nares normal. Septum midline. Mucosa normal. No drainage or sinus tenderness. Throat: lips, mucosa, and tongue normal; teeth and gums normal Neck: no adenopathy, no carotid bruit, no JVD, supple, symmetrical, trachea midline and thyroid not enlarged, symmetric, no tenderness/mass/nodules Back: symmetric, no curvature. ROM  normal. No CVA tenderness. Lungs: clear to auscultation bilaterally Breasts: normal appearance, no masses or tenderness Heart: regular rate and rhythm, S1, S2 normal, no murmur, click, rub or gallop Abdomen: soft, non-tender; bowel sounds normal; no masses,  no organomegaly Extremities: extremities normal, atraumatic, no cyanosis or edema Pulses: 2+ and symmetric Skin: Skin color, texture, turgor normal. No rashes or lesions Lymph nodes: Cervical, supraclavicular, and axillary nodes normal. Neurologic: Alert and oriented X 3, normal strength and tone. Normal symmetric reflexes. Normal coordination and gait    Assessment:    Healthy female exam.      Plan:     See After Visit Summary for Counseling Recommendations   Keep up a regular exercise program and make sure you are eating a healthy diet Try to eat 4 servings of dairy a day, or if you are lactose intolerant take a calcium with vitamin D daily.  Your vaccines are up to date.   Declined flu vaccine.

## 2016-05-23 ENCOUNTER — Encounter: Payer: Self-pay | Admitting: Sports Medicine

## 2016-05-23 ENCOUNTER — Ambulatory Visit (INDEPENDENT_AMBULATORY_CARE_PROVIDER_SITE_OTHER): Payer: BLUE CROSS/BLUE SHIELD | Admitting: Sports Medicine

## 2016-05-23 ENCOUNTER — Ambulatory Visit (INDEPENDENT_AMBULATORY_CARE_PROVIDER_SITE_OTHER): Payer: BLUE CROSS/BLUE SHIELD

## 2016-05-23 DIAGNOSIS — M1712 Unilateral primary osteoarthritis, left knee: Secondary | ICD-10-CM

## 2016-05-23 MED ORDER — MELOXICAM 15 MG PO TABS: ORAL_TABLET | ORAL | 3 refills | Status: DC

## 2016-05-23 NOTE — Assessment & Plan Note (Signed)
We will start conservatively with physical therapy, mobic, x-rays. Return to see me in one month, injection if no better.

## 2016-05-23 NOTE — Progress Notes (Signed)
   Subjective:    I'm seeing this patient as a consultation for:  Dr. Beatrice Lecher  CC: Left knee pain  HPI: For months this pleasant 50 year old female who spent some time at [employer] school of medicine has had pain that she localizes under the kneecap, moderate, persistent, worse with sitting, squatting, going up and down stairs. Gelling, over-the-counter NSAIDs have been ineffective. No mechanical symptoms, no trauma.  Past medical history:  Negative.  See flowsheet/record as well for more information.  Surgical history: Negative.  See flowsheet/record as well for more information.  Family history: Negative.  See flowsheet/record as well for more information.  Social history: Negative.  See flowsheet/record as well for more information.  Allergies, and medications have been entered into the medical record, reviewed, and no changes needed.   Review of Systems: No headache, visual changes, nausea, vomiting, diarrhea, constipation, dizziness, abdominal pain, skin rash, fevers, chills, night sweats, weight loss, swollen lymph nodes, body aches, joint swelling, muscle aches, chest pain, shortness of breath, mood changes, visual or auditory hallucinations.   Objective:   General: Well Developed, well nourished, and in no acute distress.  Neuro/Psych: Alert and oriented x3, extra-ocular muscles intact, able to move all 4 extremities, sensation grossly intact. Skin: Warm and dry, no rashes noted.  Respiratory: Not using accessory muscles, speaking in full sentences, trachea midline.  Cardiovascular: Pulses palpable, no extremity edema. Abdomen: Does not appear distended. Left Knee: Minimal effusion with visible swelling, tender to palpation at the medial joint line and the medial and lateral patellar facets Palpable patellar crepitus ROM normal in flexion and extension and lower leg rotation. Ligaments with solid consistent endpoints including ACL, PCL, LCL, MCL. Negative  Mcmurray's and provocative meniscal tests. Non painful patellar compression. Patellar and quadriceps tendons unremarkable. Hamstring and quadriceps strength is normal.  Impression and Recommendations:   This case required medical decision making of moderate complexity.  Primary osteoarthritis of left knee We will start conservatively with physical therapy, mobic, x-rays. Return to see me in one month, injection if no better.

## 2016-05-24 ENCOUNTER — Telehealth: Payer: Self-pay

## 2016-05-24 NOTE — Telephone Encounter (Signed)
Pt notified of lab results

## 2016-05-31 ENCOUNTER — Ambulatory Visit (INDEPENDENT_AMBULATORY_CARE_PROVIDER_SITE_OTHER): Payer: BLUE CROSS/BLUE SHIELD | Admitting: Physical Therapy

## 2016-05-31 ENCOUNTER — Encounter: Payer: Self-pay | Admitting: Physical Therapy

## 2016-05-31 DIAGNOSIS — R600 Localized edema: Secondary | ICD-10-CM | POA: Diagnosis not present

## 2016-05-31 DIAGNOSIS — M6281 Muscle weakness (generalized): Secondary | ICD-10-CM

## 2016-05-31 NOTE — Therapy (Signed)
[facility] [place], [place], [address] Phone: [phone]   Fax:  [phone]  Physical Therapy Evaluation  Patient Details  Name: Andrea Tran MRN: [id number] Date of Birth: 1966-01-27 Referring Provider: Dianah Field  Encounter Date: 05/31/2016      PT End of Session - 05/31/16 0912    Visit Number 1   Date for PT Re-Evaluation 07/26/16   PT Start Time 0846   PT Stop Time 0915   PT Time Calculation (min) 29 min   Activity Tolerance Patient tolerated treatment well   Behavior During Therapy [facility] for tasks assessed/performed      Past Medical History:  Diagnosis Date  . 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 HEEL SPUR RESECTION     left foot  . HIP SURGERY     left, scar tissue  . KNEE ARTHROSCOPY     right  . LASIK    . shoulder capsule surgery     left    There were no vitals filed for this visit.       Subjective Assessment - 05/31/16 0918    Diagnostic tests x ray negative   Patient Stated Goals begin exercise program            [facility] PT Assessment - 05/31/16 0001      Assessment   Medical Diagnosis Lt knee OA   Referring Provider Thekkekandam   Next MD Visit 06/20/16     Precautions   Precautions None     Restrictions   Weight Bearing Restrictions No     Balance Screen   Has the patient fallen in the past 6 months No     Observation/Other Assessments   Focus on Therapeutic Outcomes (FOTO)  44% limited     Observation/Other Assessments-Edema    Edema Circumferential     Circumferential Edema   Circumferential - Right 42 cm  superior to patella   Circumferential - Left  43 cm  superior to patella     Functional Tests   Functional tests Step down;Other     Step Down   Comments decreased stability Lt knee     Other:   Other/ Comments SLS Lt >30 seconds     ROM / Strength   AROM / PROM / Strength AROM;Strength     AROM   Overall AROM Comments bilat knee AROM WFL     Strength   Overall Strength Comments Lt knee and hip strength 4/5 bilat     Palpation   Patella mobility WFL   Palpation comment no TTP Lt knee                   [facility] Adult PT Treatment/Exercise - 05/31/16 0001      Exercises   Exercises Knee/Hip     Knee/Hip Exercises: Standing   Lateral Step Up Left;2 sets;10 reps;Hand Hold: 1   Step Down 1 set;10 reps;Hand Hold: 2;Step Height: 4"   Wall Squat 10 reps;3 seconds                PT Education - 05/31/16 0912    Education provided Yes   Education Details PT POC, HEP   Person(s) Educated Patient   Methods Explanation;Demonstration;Handout   Comprehension Verbalized understanding;Returned demonstration          PT Short Term Goals - 05/31/16 0916      PT SHORT TERM GOAL #1  Title STG=LTG           PT Long Term Goals - 05/31/16 0917      PT LONG TERM GOAL #1   Title Pt will be independent in advanced HEP   Time 8   Period Weeks   Status New     PT LONG TERM GOAL #2   Title Pt will tolerate standing x 1 hour with no increase in symptoms   Time 8   Period Weeks   Status New     PT LONG TERM GOAL #3   Title Pt will improve FOTO to <=31% to demo improved functional mobility   Time 8   Period Weeks   Status New     PT LONG TERM GOAL #4   Title Pt will begin aerobic and strengthening exercise program with no increase in symptoms   Time 8   Period Weeks   Status New               Plan - 05/31/16 0914    Clinical Impression Statement Pt presents with mild Lt knee swelling and decreased eccentric Lt knee control. Pt will benefit from skilled PT to improve functional strength to improve performance with functional and recreational tasks   Rehab Potential Good   PT Frequency 1x / week   PT Duration 8 weeks   PT Treatment/Interventions Cryotherapy;Electrical Stimulation;Iontophoresis 4mg /ml Dexamethasone;Ultrasound;Therapeutic  activities;Patient/family education;Dry needling;Manual techniques;Gait training;Stair training;Therapeutic exercise;Balance training;Taping;Neuromuscular re-education;Vasopneumatic Device   PT Next Visit Plan assess HEP and progress as tolerated   PT Home Exercise Plan wall slides, step ups lateral and step downs   Consulted and Agree with Plan of Care Patient      Patient will benefit from skilled therapeutic intervention in order to improve the following deficits and impairments:  Decreased activity tolerance, Decreased strength, Pain, Increased edema  Visit Diagnosis: Muscle weakness (generalized) - Plan: PT plan of care cert/re-cert  Localized edema - Plan: PT plan of care cert/re-cert     Problem List Patient Active Problem List   Diagnosis Date Noted  . Primary osteoarthritis of left knee 05/23/2016  . Genital herpes 02/10/2013  . HEMORRHOIDS 06/03/2010  . [place] OF TRANSAMINASE/LDH 01/07/2009    Isabelle Course, PT, DPT 05/31/2016, 9:21 AM  [facility] [place], [place], [address] Phone: [phone]   Fax:  [phone]  Name: Andrea Tran MRN: [id number] Date of Birth: 1966-01-04

## 2016-05-31 NOTE — Patient Instructions (Addendum)
Knee Extension: Step-Down Forward / Sideways / Backward (Eccentric)    Stand, holding support, affected foot on step. Slowly bend affected knee for 3-5 seconds and bring other heel forward to floor. Quickly straighten affected leg. Repeat, placing foot flat to side. Repeat, touching toe behind. ___ reps per set, ___ sets per day, ___ days per week.   http://ecce.exer.us/134   Copyright  VHI. All rights reserved.   

## 2016-06-07 ENCOUNTER — Ambulatory Visit (INDEPENDENT_AMBULATORY_CARE_PROVIDER_SITE_OTHER): Payer: BLUE CROSS/BLUE SHIELD | Admitting: Rehabilitative and Restorative Service Providers"

## 2016-06-07 ENCOUNTER — Encounter: Payer: Self-pay | Admitting: Rehabilitative and Restorative Service Providers"

## 2016-06-07 DIAGNOSIS — M6281 Muscle weakness (generalized): Secondary | ICD-10-CM

## 2016-06-07 DIAGNOSIS — R600 Localized edema: Secondary | ICD-10-CM | POA: Diagnosis not present

## 2016-06-07 NOTE — Patient Instructions (Addendum)
HIP: Hamstrings - Supine   Place strap around foot. Raise leg up, keeping knee straight.  Bend opposite knee to protect back if indicated. Hold 30 seconds. 3 reps per set, 2-3 sets per day  Outer Hip Stretch: Reclined IT Band Stretch (Strap)    Strap around opposite foot, pull across only as far as possible with shoulders on mat. Hold for __30 sec . Repeat __2-3__ times each leg.   Quads / HF, Prone   Lie face down. Grasp one ankle with same-side hand. Use towel if needed to reach. Gently pull foot toward buttock.  Hold 30 seconds. Repeat 3 times per session. Do 2-3 sessions per day. Can add roll under thigh to increase the stretch   Achilles / Gastroc, Standing    Stand, right foot behind, heel on floor and turned slightly out, leg straight, forward leg bent. Move hips forward. Hold _30__ seconds. Repeat __2-3_ times per session. Do _1-2__ sessions per day.   Achilles / Soleus, Standing    Stand, right foot behind, heel on floor and turned slightly out. Lower hips and bend knees. Hold _30__ seconds. Repeat _2-3__ times per session. Do __1-2_ sessions per day.  POSITION: Single Leg Balance    Stand on right leg. Can stand on cushion.  Hold _30-60__ seconds. _3-4__ reps ___ times per day.    Single Leg: Opposite in Motion    Stand on right leg. Move opposite leg slowly forward and side to side. Repeat __30-60 sec per session. Do __3-5__ sessions per day.    Balance: Unilateral - Forward Lean    Stand on left foot, hands on hips. Keeping hips level, bend forward as if to touch forehead to wall. Hold __2-3__ seconds. Relax. Repeat _10___ times per set. Do _1-2___ sets per session. Do __1-2__ sessions per day.

## 2016-06-07 NOTE — Therapy (Signed)
[place], [place], [address] Phone: [phone]   Fax:  [phone]  Physical Therapy Treatment  Patient Details  Name: Andrea Tran MRN: [id number] Date of Birth: [birth year] Referring Provider: Dianah Tran  Encounter Date: 06/07/2016      PT End of Session - 06/07/16 0719    Visit Number 2   Number of Visits 8   Date for PT Re-Evaluation 07/26/16   PT Start Time 0719   PT Stop Time 0804   PT Time Calculation (min) 45 min   Activity Tolerance Patient tolerated treatment well      Past Medical History:  Diagnosis Date  . 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 HEEL SPUR RESECTION     left foot  . HIP SURGERY     left, scar tissue  . KNEE ARTHROSCOPY     right  . LASIK    . shoulder capsule surgery     left    There were no vitals filed for this visit.      Subjective Assessment - 06/07/16 0720    Subjective No significant change in the knee pain. Working on exercises at home. Notices that she will have pain in the Lt knee when she sleeps on her stomach.    Currently in Pain? No/denies                         [facility] Adult PT Treatment/Exercise - 06/07/16 0001      Knee/Hip Exercises: Stretches   Passive Hamstring Stretch 3 reps;30 seconds   Quad Stretch 3 reps;30 seconds  prone w/ strap   Quad Stretch Limitations added half foam roll distal thigh for last two stretches    Other Knee/Hip Stretches lateral hip stretch supine with strap 30 sec x 1     Knee/Hip Exercises: Standing   Lateral Step Up Left;2 sets;10 reps;Hand Hold: 1;Right   Step Down 1 set;10 reps;Hand Hold: 2;Step Height: 4";2 sets;Left;Right   Wall Squat 5 reps;10 seconds   SLS 30 sec x 3 Lt/Rt with blue TB pad    SLS with Vectors forward lean Lt LE x 10    Other Standing Knee Exercises leg swing with SLS 20-30 sec x 2      Cryotherapy   Number Minutes  Cryotherapy 15 Minutes   Cryotherapy Location Knee  bilat    Type of Cryotherapy Ice pack     Manual Therapy   Kinesiotex --  correction for lateral patellar position Lt knee                 PT Education - 06/07/16 0800    Education provided Yes   Education Details HEP   Person(s) Educated Patient   Methods Explanation;Demonstration;Tactile cues;Verbal cues;Handout   Comprehension Verbalized understanding;Returned demonstration;Verbal cues required;Tactile cues required          PT Short Term Goals - 05/31/16 0916      PT SHORT TERM GOAL #1   Title STG=LTG           PT Long Term Goals - 06/07/16 0724      PT LONG TERM GOAL #1   Title Pt will be independent in advanced HEP   Time 8   Period Weeks   Status On-going     PT LONG TERM GOAL #2   Title Pt will tolerate standing x 1 hour  with no increase in symptoms   Time 8   Period Weeks   Status On-going     PT LONG TERM GOAL #3   Title Pt will improve FOTO to <=31% to demo improved functional mobility   Time 8   Period Weeks   Status On-going     PT LONG TERM GOAL #4   Title Pt will begin aerobic and strengthening exercise program with no increase in symptoms   Time 8   Period Weeks   Status On-going               Plan - 06/07/16 0802    Clinical Impression Statement No significant changes in Lt knee pain. Patient tolerated treatment with additional exercises without difficulty.    Rehab Potential Good   PT Frequency 1x / week   PT Duration 8 weeks   PT Treatment/Interventions Cryotherapy;Electrical Stimulation;Iontophoresis 4mg /ml Dexamethasone;Ultrasound;Therapeutic activities;Patient/family education;Dry needling;Manual techniques;Gait training;Stair training;Therapeutic exercise;Balance training;Taping;Neuromuscular re-education;Vasopneumatic Device   PT Next Visit Plan progress with stretching and strengthening as tolerated    PT Home Exercise Plan see HEP    Consulted and Agree with  Plan of Care Patient      Patient will benefit from skilled therapeutic intervention in order to improve the following deficits and impairments:  Decreased activity tolerance, Decreased strength, Pain, Increased edema  Visit Diagnosis: Muscle weakness (generalized)  Localized edema     Problem List Patient Active Problem List   Diagnosis Date Noted  . Primary osteoarthritis of left knee 05/23/2016  . Genital herpes 02/10/2013  . HEMORRHOIDS 06/03/2010  . [name] OF LEVELS OF TRANSAMINASE/LDH 01/07/2009    Celyn Nilda Simmer PT, MPH  06/07/2016, 8:05 AM  [facility] [place], [place], [address] Phone: [phone]   Fax:  [phone]  Name: Andrea Tran MRN: [id number] Date of Birth: 08/30/1965

## 2016-06-14 ENCOUNTER — Ambulatory Visit (INDEPENDENT_AMBULATORY_CARE_PROVIDER_SITE_OTHER): Payer: BLUE CROSS/BLUE SHIELD | Admitting: Rehabilitative and Restorative Service Providers"

## 2016-06-14 ENCOUNTER — Encounter: Payer: Self-pay | Admitting: Rehabilitative and Restorative Service Providers"

## 2016-06-14 DIAGNOSIS — M6281 Muscle weakness (generalized): Secondary | ICD-10-CM

## 2016-06-14 DIAGNOSIS — R600 Localized edema: Secondary | ICD-10-CM

## 2016-06-14 NOTE — Patient Instructions (Addendum)
Anterior    Stand with equal weight on both feet. Lunge with right leg along A direction and return _10__ times. _1-2 sets__ reps _1__ times per day.   Strengthening: Hip Extension - Resisted    With tubing around right ankle, face anchor and pull leg straight back. Repeat __10__ times per set. Do _1-3___ sets per session. Do _1___ sessions per day.    Strengthening: Hip Abduction - Resisted    With tubing around right leg, other side toward anchor, extend leg out from side. Repeat _10__ times per set. Do _1-3___ sets per session. Do __1__ sessions per day.

## 2016-06-14 NOTE — Therapy (Addendum)
[place], [place], [address] Phone: [phone]   Fax:  [phone]  Physical Therapy Treatment  Patient Details  Name: Andrea Tran MRN: [id number] Date of Birth: 29-Aug-1965 Referring Provider: Dr Dianah Field  Encounter Date: 06/14/2016      PT End of Session - 06/14/16 0720    Visit Number 3   Number of Visits 8   Date for PT Re-Evaluation 07/26/16   PT Start Time 0717   PT Stop Time 0804   PT Time Calculation (min) 47 min   Activity Tolerance Patient tolerated treatment well      Past Medical History:  Diagnosis Date  . 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 HEEL SPUR RESECTION     left foot  . HIP SURGERY     left, scar tissue  . KNEE ARTHROSCOPY     right  . LASIK    . shoulder capsule surgery     left    There were no vitals filed for this visit.      Subjective Assessment - 06/14/16 0720    Subjective Patient reports that the stretching has helped. She still has some "crunching" with the steps but less pain in the knee. Tape has stayed well and seems to help. She is doing her exercises at home.    Currently in Pain? No/denies            [facility] PT Assessment - 06/14/16 0001      Assessment   Medical Diagnosis Lt knee OA   Referring Provider Dr Dianah Field   Next MD Visit 06/20/16     Other:   Other/ Comments SLS Lt >30 seconds     AROM   Overall AROM Comments bilat knee AROM WFL     Strength   Overall Strength Comments Lt knee 5/5; Lt hip 5/5 except extension 4+/5      Palpation   Patella mobility WFL   Palpation comment crepitus bilat patellas                      [facility] Adult PT Treatment/Exercise - 06/14/16 0001      Knee/Hip Exercises: Stretches   Passive Hamstring Stretch 3 reps;30 seconds   Quad Stretch 3 reps;30 seconds  prone w/ strap   Quad Stretch Limitations added half foam roll distal  thigh for last two stretches    Gastroc Stretch 2 reps;30 seconds   Soleus Stretch 2 reps;30 seconds   Other Knee/Hip Stretches lateral hip stretch supine with strap 30 sec x 1   Other Knee/Hip Stretches adductor stretch supine w/ strap 30 sec x 2      Knee/Hip Exercises: Standing   Lateral Step Up Left;2 sets;10 reps;Hand Hold: 1;Right   Step Down 1 set;10 reps;Hand Hold: 2;Step Height: 4";2 sets;Left;Right   Wall Squat 5 reps;10 seconds   SLS 30 sec x 3 Lt/Rt with blue TB pad    SLS with Vectors forward lean Lt LE x 10    Other Standing Knee Exercises leg swing with SLS 20-30 sec x 2      Manual Therapy   Kinesiotex --  correction for lateral patellar position Lt knee                 PT Education - 06/14/16 0749    Education provided Yes   Education Details HEP   Person(s) Educated Patient  Methods Explanation;Demonstration;Tactile cues;Verbal cues;Handout   Comprehension Verbalized understanding;Returned demonstration;Verbal cues required;Tactile cues required          PT Short Term Goals - 05/31/16 0916      PT SHORT TERM GOAL #1   Title STG=LTG           PT Long Term Goals - 06/14/16 0726      PT LONG TERM GOAL #1   Title Pt will be independent in advanced HEP   Time 8   Period Weeks   Status On-going     PT LONG TERM GOAL #2   Title Pt will tolerate standing x 1 hour with no increase in symptoms   Time 8   Period Weeks   Status On-going     PT LONG TERM GOAL #3   Title Pt will improve FOTO to <=31% to demo improved functional mobility   Time 8   Period Weeks   Status On-going     PT LONG TERM GOAL #4   Title Pt will begin aerobic and strengthening exercise program with no increase in symptoms   Time 8   Period Weeks   Status On-going               Plan - 06/14/16 1901    Clinical Impression Statement Improving symptoms in Rt knee. Patient is consistent with her HEP. Returns to MD next week 06/20/16. Will wait until after MD  visit to schedule additional PT visits.    Rehab Potential Good   PT Frequency 1x / week   PT Duration 8 weeks   PT Treatment/Interventions Cryotherapy;Electrical Stimulation;Iontophoresis 56m/ml Dexamethasone;Ultrasound;Therapeutic activities;Patient/family education;Dry needling;Manual techniques;Gait training;Stair training;Therapeutic exercise;Balance training;Taping;Neuromuscular re-education;Vasopneumatic Device   PT Next Visit Plan progress with stretching and strengthening as tolerated    PT Home Exercise Plan see HEP    Consulted and Agree with Plan of Care Patient      Patient will benefit from skilled therapeutic intervention in order to improve the following deficits and impairments:  Decreased activity tolerance, Decreased strength, Pain, Increased edema  Visit Diagnosis: Muscle weakness (generalized)  Localized edema     Problem List Patient Active Problem List   Diagnosis Date Noted  . Primary osteoarthritis of left knee 05/23/2016  . Genital herpes 02/10/2013  . HEMORRHOIDS 06/03/2010  . NONSPEC ELEVATION OF LEVELS OF TRANSAMINASE/LDH 01/07/2009    Aaronjames Kelsay PNilda SimmerPT, MPH  06/14/2016, 8:00 AM  [facility] [address]Phone: [phone]  Fax:  [phone] Name: LKATHARINE ROCHEFORTMRN: [id number] of Birth: 212-Feb-1967 PHYSICAL THERAPY DISCHARGE SUMMARY  Visits from Start of Care: 3  Current functional level related to goals / functional outcomes: See PT progress note for discharge status   Remaining deficits: See note   Education / Equipment: HEP Plan: Patient agrees to discharge.  Patient goals were partially met. Patient is being discharged due to the patient's request.  ?????     Shanel Prazak P. HHelene KelpPT, MPH 06/27/16 12:38 PM

## 2016-06-20 ENCOUNTER — Ambulatory Visit (INDEPENDENT_AMBULATORY_CARE_PROVIDER_SITE_OTHER): Payer: BLUE CROSS/BLUE SHIELD | Admitting: Sports Medicine

## 2016-06-20 ENCOUNTER — Encounter: Payer: Self-pay | Admitting: Sports Medicine

## 2016-06-20 DIAGNOSIS — M1712 Unilateral primary osteoarthritis, left knee: Secondary | ICD-10-CM | POA: Diagnosis not present

## 2016-06-20 MED ORDER — ACETAMINOPHEN ER 650 MG PO TBCR: 650.0000 mg | EXTENDED_RELEASE_TABLET | Freq: Two times a day (BID) | ORAL | 3 refills | Status: DC | PRN

## 2016-06-20 NOTE — Progress Notes (Signed)
  Subjective:    CC: Follow-up  HPI: Left knee osteoarthritis: 80% improved with meloxicam and rehabilitation.  Past medical history:  Negative.  See flowsheet/record as well for more information.  Surgical history: Negative.  See flowsheet/record as well for more information.  Family history: Negative.  See flowsheet/record as well for more information.  Social history: Negative.  See flowsheet/record as well for more information.  Allergies, and medications have been entered into the medical record, reviewed, and no changes needed.   Review of Systems: No fevers, chills, night sweats, weight loss, chest pain, or shortness of breath.   Objective:    General: Well Developed, well nourished, and in no acute distress.  Neuro: Alert and oriented x3, extra-ocular muscles intact, sensation grossly intact.  HEENT: Normocephalic, atraumatic, pupils equal round reactive to light, neck supple, no masses, no lymphadenopathy, thyroid nonpalpable.  Skin: Warm and dry, no rashes. Cardiac: Regular rate and rhythm, no murmurs rubs or gallops, no lower extremity edema.  Respiratory: Clear to auscultation bilaterally. Not using accessory muscles, speaking in full sentences. Left Knee: Normal to inspection with no erythema or effusion or obvious bony abnormalities. Palpation normal with no warmth or joint line tenderness or patellar tenderness or condyle tenderness. ROM normal in flexion and extension and lower leg rotation. Ligaments with solid consistent endpoints including ACL, PCL, LCL, MCL. Negative Mcmurray's and provocative meniscal tests. Non painful patellar compression. Patellar and quadriceps tendons unremarkable. Hamstring and quadriceps strength is normal.  Impression and Recommendations:    Primary osteoarthritis of left knee 80% improvement in pain with physical therapy, meloxicam. Pain is down to 2, not severe enough to consider interventional treatment, I'm going to add arthritis  strength Tylenol. Return as needed.

## 2016-06-20 NOTE — Assessment & Plan Note (Signed)
80% improvement in pain with physical therapy, meloxicam. Pain is down to 2, not severe enough to consider interventional treatment, I'm going to add arthritis strength Tylenol. Return as needed.

## 2016-09-24 ENCOUNTER — Other Ambulatory Visit: Payer: Self-pay | Admitting: Sports Medicine

## 2016-09-24 DIAGNOSIS — M1712 Unilateral primary osteoarthritis, left knee: Secondary | ICD-10-CM

## 2016-10-25 ENCOUNTER — Ambulatory Visit (INDEPENDENT_AMBULATORY_CARE_PROVIDER_SITE_OTHER): Payer: Commercial Managed Care - PPO | Admitting: Family Medicine

## 2016-10-25 VITALS — BP 112/75 | HR 82 | Wt 189.0 lb

## 2016-10-25 DIAGNOSIS — Z7189 Other specified counseling: Secondary | ICD-10-CM

## 2016-10-25 DIAGNOSIS — Z23 Encounter for immunization: Secondary | ICD-10-CM

## 2016-10-25 DIAGNOSIS — M1712 Unilateral primary osteoarthritis, left knee: Secondary | ICD-10-CM | POA: Diagnosis not present

## 2016-10-25 DIAGNOSIS — Z7184 Encounter for health counseling related to travel: Secondary | ICD-10-CM

## 2016-10-25 MED ORDER — MELOXICAM 15 MG PO TABS: ORAL_TABLET | ORAL | 5 refills | Status: DC

## 2016-10-25 MED ORDER — ATOVAQUONE-PROGUANIL HCL 250-100 MG PO TABS: 1.0000 | ORAL_TABLET | Freq: Every day | ORAL | 0 refills | Status: DC

## 2016-10-25 MED ORDER — CIPROFLOXACIN HCL 500 MG PO TABS: 500.0000 mg | ORAL_TABLET | Freq: Two times a day (BID) | ORAL | 0 refills | Status: AC

## 2016-10-25 MED ORDER — TYPHOID VACCINE PO CPDR: 1.0000 | DELAYED_RELEASE_CAPSULE | ORAL | 0 refills | Status: DC

## 2016-10-25 NOTE — Patient Instructions (Addendum)
Twinrix due again  Days 0, 7, and 21 to 30 followed by a booster dose at Month 12 months.    Hepatitis A Vaccine: What You Need to Know 1. Why get vaccinated? Hepatitis A is a serious liver disease. It is caused by the hepatitis A virus (HAV). HAV is spread from person to person through contact with the feces (stool) of people who are infected, which can easily happen if someone does not wash his or her hands properly. You can also get hepatitis A from food, water, or objects contaminated with HAV. Symptoms of hepatitis A can include:  fever, fatigue, loss of appetite, nausea, vomiting, and/or joint pain  severe stomach pains and diarrhea (mainly in children), or  jaundice (yellow skin or eyes, dark urine, clay-colored bowel movements). These symptoms usually appear 2 to 6 weeks after exposure and usually last less than 2 months, although some people can be ill for as long as 6 months. If you have hepatitis A you may be too ill to work. Children often do not have symptoms, but most adults do. You can spread HAV without having symptoms. Hepatitis A can cause liver failure and death, although this is rare and occurs more commonly in persons 42 years of age or older and persons with other liver diseases, such as hepatitis B or C. Hepatitis A vaccine can prevent hepatitis A. Hepatitis A vaccines were recommended in the [place] beginning in 1996. Since then, the number of cases reported each year in the [place] has dropped from around 31,000 cases to fewer than 1,500 cases. 2. Hepatitis A vaccine Hepatitis A vaccine is an inactivated (killed) vaccine. You will need 2 doses for long-lasting protection. These doses should be given at least 6 months apart. Children are routinely vaccinated between their first and second birthdays (35 through 1 months of age). Older children and adolescents can get the vaccine after 23 months. Adults who have not been vaccinated previously and want to be protected  against hepatitis A can also get the vaccine. You should get hepatitis A vaccine if you:  are traveling to countries where hepatitis A is common,  are a man who has sex with other men,  use illegal drugs,  have a chronic liver disease such as hepatitis B or hepatitis C,  are being treated with clotting-factor concentrates,  work with hepatitis A-infected animals or in a hepatitis A research laboratory, or  expect to have close personal contact with an international adoptee from a country where hepatitis A is common Ask your healthcare provider if you want more information about any of these groups. There are no known risks to getting hepatitis A vaccine at the same time as other vaccines. 3. Some people should not get this vaccine Tell the person who is giving you the vaccine:  If you have any severe, life-threatening allergies. If you ever had a life-threatening allergic reaction after a dose of hepatitis A vaccine, or have a severe allergy to any part of this vaccine, you may be advised not to get vaccinated. Ask your health care provider if you want information about vaccine components.  If you are not feeling well. If you have a mild illness, such as a cold, you can probably get the vaccine today. If you are moderately or severely ill, you should probably wait until you recover. Your doctor can advise you. 4. Risks of a vaccine reaction With any medicine, including vaccines, there is a chance of side effects. These are usually  mild and go away on their own, but serious reactions are also possible. Most people who get hepatitis A vaccine do not have any problems with it. Minor problems following hepatitis A vaccine include:  soreness or redness where the shot was given  low-grade fever  headache  tiredness If these problems occur, they usually begin soon after the shot and last 1 or 2 days. Your doctor can tell you more about these reactions. Other problems that could happen  after this vaccine:   People sometimes faint after a medical procedure, including vaccination. Sitting or lying down for about 15 minutes can help prevent fainting, and injuries caused by a fall. Tell your provider if you feel dizzy, or have vision changes or ringing in the ears.  Some people get shoulder pain that can be more severe and longer lasting than the more routine soreness that can follow injections. This happens very rarely.  Any medication can cause a severe allergic reaction. Such reactions from a vaccine are very rare, estimated at about 1 in a million doses, and would happen within a few minutes to a few hours after the vaccination. As with any medicine, there is a very remote chance of a vaccine causing a serious injury or death. The safety of vaccines is always being monitored. For more information, visit: http://www.aguilar.org/ 5. What if there is a serious problem? What should I look for?  Look for anything that concerns you, such as signs of a severe allergic reaction, very high fever, or unusual behavior. Signs of a severe allergic reaction can include hives, swelling of the face and throat, difficulty breathing, a fast heartbeat, dizziness, and weakness. These would start a few minutes to a few hours after the vaccination. What should I do?   If you think it is a severe allergic reaction or other emergency that can't wait, call 9-1-1 or get to the nearest hospital. Otherwise, call your clinic.  Afterward, the reaction should be reported to the Vaccine Adverse Event Reporting System (VAERS). Your doctor should file this report, or you can do it yourself through the VAERS web site at www.vaers.SamedayNews.es, or by calling 864-034-2316.  VAERS does not give medical advice. 6. The National Vaccine Injury Compensation Program The [employer] Vaccine Injury Compensation Program (VICP) is a federal program that was created to compensate people who may have been injured by certain  vaccines. Persons who believe they may have been injured by a vaccine can learn about the program and about filing a claim by calling 7044204977 or visiting the [place] website at GoldCloset.com.ee. There is a time limit to file a claim for compensation. 7. How can I learn more?  Ask your healthcare provider. He or she can give you the vaccine package insert or suggest other sources of information.  Call your local or state health department.  Contact the Centers for Disease Control and Prevention (CDC):  Call 331-819-0108 (1-800-CDC-INFO) or  Visit CDC's website at http://hunter.com/ CDC Hepatitis A Vaccine VIS (02/03/2015) This information is not intended to replace advice given to you by your health care provider. Make sure you discuss any questions you have with your health care provider. Document Released: 04/27/2006 Document Revised: 03/23/2016 Document Reviewed: 03/23/2016 Elsevier Interactive Patient Education  2017 [place]. Hepatitis B Vaccine: What You Need to Know 1. Why get vaccinated? Hepatitis B is a serious disease that affects the liver. It is caused by the hepatitis B virus. Hepatitis B can cause mild illness lasting a few weeks, or  it can lead to a serious, lifelong illness. Hepatitis B virus infection can be either acute or chronic. Acute hepatitis B virus infection is a short-term illness that occurs within the first 6 months after someone is exposed to the hepatitis B virus. This can lead to:  fever, fatigue, loss of appetite, nausea, and/or vomiting  jaundice (yellow skin or eyes, dark urine, clay-colored bowel movements)  pain in muscles, joints, and stomach Chronic hepatitis B virus infection is a long-term illness that occurs when the hepatitis B virus remains in a person's body. Most people who go on to develop chronic hepatitis B do not have symptoms, but it is still very serious and can lead to:  liver damage (cirrhosis)  liver  cancer  death Chronically-infected people can spread hepatitis B virus to others, even if they do not feel or look sick themselves. Up to 1.4 million people in the [place] may have chronic hepatitis B infection. About 90% of infants who get hepatitis B become chronically infected and about 1 out of 4 of them dies. Hepatitis B is spread when blood, semen, or other body fluid infected with the Hepatitis B virus enters the body of a person who is not infected. People can become infected with the virus through:  Birth (a baby whose mother is infected can be infected at or after birth)  Sharing items such as razors or toothbrushes with an infected person  Contact with the blood or open sores of an infected person  Sex with an infected partner  Sharing needles, syringes, or other drug-injection equipment  Exposure to blood from needlesticks or other sharp instruments Each year about 2,000 people in the [place] die from hepatitis B-related liver disease. Hepatitis B vaccine can prevent hepatitis B and its consequences, including liver cancer and cirrhosis. 2. Hepatitis B vaccine Hepatitis B vaccine is made from parts of the hepatitis B virus. It cannot cause hepatitis B infection. The vaccine is usually given as 3 or 4 shots over a 36-month period. Infants should get their first dose of hepatitis B vaccine at birth and will usually complete the series at 58 months of age. All children and adolescents younger than 75 years of age who have not yet gotten the vaccine should also be vaccinated. Hepatitis B vaccine is recommended for unvaccinated adults who are at risk for hepatitis B virus infection, including:  People whose sex partners have hepatitis B  Sexually active persons who are not in a long-term monogamous relationship  Persons seeking evaluation or treatment for a sexually transmitted disease  Men who have sexual contact with other men  People who share needles, syringes,  or other drug-injection equipment  People who have household contact with someone infected with the hepatitis B virus  Health care and public safety workers at risk for exposure to blood or body fluids  Residents and staff of facilities for developmentally disabled persons  Persons in correctional facilities  Victims of sexual assault or abuse  Travelers to regions with increased rates of hepatitis B  People with chronic liver disease, kidney disease, HIV infection, or diabetes  Anyone who wants to be protected from hepatitis B There are no known risks to getting hepatitis B vaccine at the same time as other vaccines. 3. Some people should not get this vaccine Tell the person who is giving the vaccine:  If the person getting the vaccine has any severe, life-threatening allergies.  If you ever had a life-threatening allergic reaction after a  dose of hepatitis B vaccine, or have a severe allergy to any part of this vaccine, you may be advised not to get vaccinated. Ask your health care provider if you want information about vaccine components.  If the person getting the vaccine is not feeling well.  If you have a mild illness, such as a cold, you can probably get the vaccine today. If you are moderately or severely ill, you should probably wait until you recover. Your doctor can advise you. 4. Risks of a vaccine reaction With any medicine, including vaccines, there is a chance of side effects. These are usually mild and go away on their own, but serious reactions are also possible. Most people who get hepatitis B vaccine do not have any problems with it. Minor problems following hepatitis B vaccine include:  soreness where the shot was given  temperature of 99.42F or higher If these problems occur, they usually begin soon after the shot and last 1 or 2 days. Your doctor can tell you more about these reactions. Other problems that could happen after this vaccine:   People  sometimes faint after a medical procedure, including vaccination. Sitting or lying down for about 15 minutes can help prevent fainting and injuries caused by a fall. Tell your provider if you feel dizzy, or have vision changes or ringing in the ears.  Some people get shoulder pain that can be more severe and longer-lasting than the more routine soreness that can follow injections. This happens very rarely.  Any medication can cause a severe allergic reaction. Such reactions from a vaccine are very rare, estimated at about 1 in a million doses, and would happen within a few minutes to a few hours after the vaccination. As with any medicine, there is a very remote chance of a vaccine causing a serious injury or death. The safety of vaccines is always being monitored. For more information, visit: http://www.aguilar.org/ 5. What if there is a serious problem? What should I look for?  Look for anything that concerns you, such as signs of a severe allergic reaction, very high fever, or unusual behavior. Signs of a severe allergic reaction can include hives, swelling of the face and throat, difficulty breathing, a fast heartbeat, dizziness, and weakness. These would start a few minutes to a few hours after the vaccination. What should I do?   If you think it is a severe allergic reaction or other emergency that can't wait, call 9-1-1 or get to the nearest hospital. Otherwise, call your clinic.  Afterward, the reaction should be reported to the Vaccine Adverse Event Reporting System (VAERS). Your doctor should file this report, or you can do it yourself through the VAERS web site at www.vaers.SamedayNews.es, or by calling (352)556-2191.  VAERS does not give medical advice. 6. The National Vaccine Injury Compensation Program The [employer] Vaccine Injury Compensation Program (VICP) is a federal program that was created to compensate people who may have been injured by certain vaccines. Persons who believe they  may have been injured by a vaccine can learn about the program and about filing a claim by calling 202-064-8769 or visiting the [place] website at GoldCloset.com.ee. There is a time limit to file a claim for compensation. 7. How can I learn more?  Ask your healthcare provider. He or she can give you the vaccine package insert or suggest other sources of information.  Call your local or state health department.  Contact the Centers for Disease Control and Prevention (CDC):  Call (219)611-4373 (  1-800-CDC-INFO) or  Visit CDC's website at http://hunter.com/ CDC Hepatitis B VIS (02/03/2015) This information is not intended to replace advice given to you by your health care provider. Make sure you discuss any questions you have with your health care provider. Document Released: 04/27/2006 Document Revised: 03/23/2016 Document Reviewed: 03/23/2016 Elsevier Interactive Patient Education  2017 [place] poisoning is an illness caused by organisms present in something you ate or drank. Some types of food poisoning trigger symptoms quickly. Others may take 1-2 weeks for symptoms to appear. Symptoms of food poisoning include:  Diarrhea.  Cramping.  Fever.  Vomiting.  Dizziness.  Aches and pains. Before you travel, learn as much as you can about the foodborne illnesses that are common in the areas where you are going. The risk for food poisoning varies from country to country and from one region of the world to another. Countries with a low risk include:  The [place].  [place].  [place].  [place].  [place].  Some countries in [place]. Countries with a mid-range risk include:  Countries in [place].  [place].  Some [place]. Countries with a high risk include:  Countries in [place].  Countries in the [place] in [place].  [place].  Countries in [place] and [place]. What  types of illness can be passed through food and drinks? Most cases of food poisoning are caused by bacteria or viruses, such as:  E. coli.  Campylobacteriosis.  Shigellosis.  Salmonellosis.  Norovirus.  Rotavirus.  Astrovirus. Food poisoning can also be caused by some microscopic parasites. These are organisms that live off of another larger organism. Illness caused by parasites can take 1-2 weeks to appear and may last several months. The illnesses include:  Giardiasis.  Amebiasis.  Cyclosporiasis.  Cryptosporidiosis. Medicines are available to treat these infections. How can I decrease my risk of food poisoning while traveling? Good hand hygiene always helps protect your health. Carry small bottles of alcohol-based hand sanitizer. Use it to clean your hands before you eat. Also follow these basic guidelines for eating and drinking while traveling. Foods that are generally safe to eat:  Food that is thoroughly cooked.  Food that is served hot.  Hard-boiled eggs.  Fruits and vegetables you wash and peel yourself.  Milk or cheese that is treated with high heat (pasteurized). Foods to avoid:  Raw or undercooked foods.  Raw or runny eggs.  Food that is not hot (such as food that has been on a buffet or picnic table for a while).  Raw fruits or vegetables that have not been washed and peeled.  Other items made with fresh vegetables or fruits, like salad and salsa.  Milk or cheese that is not pasteurized.  Meat from local animals, such as monkeys and bats. Drinks that are generally safe:  Bottled waters, soda, or sports drinks.  Drinks you know were sealed until you opened them.  Water you know has been treated, boiled, or filtered to remove microorganisms.  Ice from treated or bottled water.  Drinks made with boiling water, such as tea or coffee.  Pasteurized milk. Drinks to avoid:  Water from the tap or a well.  Water from a fresh water source, such  as a stream.  Ice from a tap, well, or fresh water source.  Beverages that include water from a well, tap, or fresh water source.  Milk that is not pasteurized.  Beverages from soda fountains. What should  I do if I think I have developed food poisoning while traveling?  If you have been vomiting or have diarrhea, drink water or other fluids to replace what you lost.  Take over-the-counter medicine to stop diarrhea. Consider packing a supply of antidiarrheal medicine to take with you.  Contact your health care provider if your symptoms do not clear up after a few days. How is food poisoning treated? Most cases of food poisoning go away without treatment within 48 hours. Food poisoning caused by bacteria may be treated with antibiotic medicines.Viruses cannot be treated with antibiotics.Illnesses caused by parasites might respond to antiparasitic medicines. You can also treat symptoms of food poisoning with medicines to:  Prevent or slow diarrhea (antidiarrheals).  Rehydrate your body. Oral rehydration therapy (ORT) helps your body replace fluids and electrolytes lost in diarrhea or vomit.  Treat rashes caused by diarrhea using hydrocortisone cream. Should I be proactively treated for food poisoning before I travel? Talk to your health care provider about your personal risk for contracting food poisoning while traveling. Discuss the foodborne illnesses that are common in the areas you plan to visit. Make sure you understand how to use any medicines your health care provider recommends. Some medicines can help prevent food poisoning. These include:  Bismuth subsalicylate (BSS). This is an ingredient in over-the-counter antidiarrheal medicines. It might help some adult travelers prevent illness.  Probiotics or "good" bacteria. Taking probiotics might help prevent some illnesses.  Antibiotics. These protect against bacteria only. Taking antibiotics to prevent food poisoning is usually  suggested only for people who have a compromised immune system. How can I learn more?  Visit a travel medicine clinic or speak with a health care provider who specializes in travel medicine as soon as you know your travel plans.  Check the Travelers' Health section on the website of the Centers for Disease Control and Prevention (CDC): WaveHands.com.ee This information is not intended to replace advice given to you by your health care provider. Make sure you discuss any questions you have with your health care provider. Document Released: 09/23/2002 Document Revised: 11/30/2015 Document Reviewed: 10/10/2013 Elsevier Interactive Patient Education  2017 [employer].

## 2016-10-25 NOTE — Progress Notes (Signed)
Subjective:    Patient ID: Andrea Tran, female    DOB: [birth year], 51 y.o.   MRN: [id number]  HPI Female comes in today to discuss travel to [place] for a missions trip. She would like to know if she is up-to-date on her vaccinations. Her last tetanus was in 2010. She has not had prior vaccination with hepatitis A or hepatitis B. She will be there for about 8 days and will leave at the end of July.   Review of Systems   BP 112/75   Pulse 82   Wt 189 lb (85.7 kg)   BMI 31.45 kg/m     No Known Allergies  Past Medical History:  Diagnosis Date  . 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 HEEL SPUR RESECTION     left foot  . HIP SURGERY     left, scar tissue  . KNEE ARTHROSCOPY     right  . LASIK    . shoulder capsule surgery     left    Social History   Social History  . Marital status: Married    Spouse name: Ed  . Number of children: 0   . Years of education: N/A   Occupational History  . Sales Assot    Social History Main Topics  . Smoking status: Never Smoker  . Smokeless tobacco: Not on file  . Alcohol use 0.0 - 0.5 oz/week    0 - 1 drink(s) per week  . Drug use: No  . 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   Other Topics Concern  . Not on file   Social History Narrative   1 caffeine per day. Plays tennis 2-3 x per week. On step duaghter    Family History  Problem Relation Age of Onset  . Heart attack Maternal Grandmother   . Hyperlipidemia Maternal Grandmother   . Diabetes Maternal Grandmother   . Thyroid disease Maternal Grandmother     hyper  . Heart attack Paternal Grandfather   . Hypertension Father   . Thyroid disease Mother     hypo  . Thyroid disease Maternal Aunt     hypo    Outpatient Encounter Prescriptions as of 10/25/2016  Medication Sig  . acetaminophen (TYLENOL) 650 MG CR  tablet Take 1 tablet (650 mg total) by mouth 2 (two) times daily as needed for pain.  . meloxicam (MOBIC) 15 MG tablet TAKE 1 TABLET BY MOUTH EVERY MORNING WITH BREAKFAST FOR 2 WEEKS THEN AS NEEDED DAILY FOR PAIN  . norgestimate-ethinyl estradiol (MONONESSA) 0.25-35 MG-MCG tablet Take 1 tablet by mouth daily.  . valACYclovir (VALTREX) 500 MG tablet Take 1 tablet (500 mg total) by mouth daily.  Marland Kitchen atovaquone-proguanil (MALARONE) 250-100 MG TABS tablet Take 1 tablet by mouth daily. Start 2 days before travel.  . ciprofloxacin (CIPRO) 500 MG tablet Take 1 tablet (500 mg total) by mouth 2 (two) times daily.  . typhoid (VIVOTIF) DR capsule Take 1 capsule by mouth every other day. Make sure to complete medication a week before travel.   No facility-administered encounter medications on file as of 10/25/2016.           Objective:   Physical Exam  Constitutional: She is oriented to person, place, and time. She appears well-developed and well-nourished.  HENT:  Head: Normocephalic and  atraumatic.  Eyes: Conjunctivae and EOM are normal.  Cardiovascular: Normal rate.   Pulmonary/Chest: Effort normal.  Neurological: She is alert and oriented to person, place, and time.  Skin: Skin is dry. No pallor.  Psychiatric: She has a normal mood and affect. Her behavior is normal.  Vitals reviewed.      Assessment & Plan:  Travel counseling-given the first Twinrix today. Will follow up in 7 days for expedited vaccination schedule. Tetanus is up-to-date. She reports her childhood vaccinations are up-to-date as well. Sent a prescription for typhoid, these OT to her pharmacy. She is to make sure she completes that vaccination at least a week prior to travel. We also discussed malaria prevention. We'll start with milrinone she'll start taking it 2 days prior to travel and continue to take it during travel and 7 days after she returns to the [place]. Given cipro to take for travelers diarrhea.    She also  recently requested a refill on the meloxicam.

## 2016-11-01 ENCOUNTER — Ambulatory Visit: Payer: Self-pay

## 2016-11-01 ENCOUNTER — Ambulatory Visit (INDEPENDENT_AMBULATORY_CARE_PROVIDER_SITE_OTHER): Payer: Commercial Managed Care - PPO | Admitting: Family Medicine

## 2016-11-01 VITALS — BP 113/55 | HR 85 | Wt 190.0 lb

## 2016-11-01 DIAGNOSIS — Z23 Encounter for immunization: Secondary | ICD-10-CM | POA: Diagnosis not present

## 2016-11-01 NOTE — Progress Notes (Signed)
Pt her to receive 2nd twinrix vaccination. Injection tolerated well. She will return to clinic in 21-30 days for 3rd injection between May 9-18, 2018.Elouise Munroe'

## 2016-11-01 NOTE — Patient Instructions (Signed)
Return to clinic for 3rd Twinrix injection May 9th

## 2016-11-07 ENCOUNTER — Ambulatory Visit (INDEPENDENT_AMBULATORY_CARE_PROVIDER_SITE_OTHER): Payer: Commercial Managed Care - PPO | Admitting: Family Medicine

## 2016-11-07 ENCOUNTER — Encounter: Payer: Self-pay | Admitting: Family Medicine

## 2016-11-07 VITALS — BP 113/56 | HR 80 | Ht 65.0 in | Wt 190.0 lb

## 2016-11-07 DIAGNOSIS — R319 Hematuria, unspecified: Secondary | ICD-10-CM

## 2016-11-07 DIAGNOSIS — N750 Cyst of Bartholin's gland: Secondary | ICD-10-CM

## 2016-11-07 LAB — POCT URINALYSIS DIPSTICK
Bilirubin, UA: NEGATIVE
Glucose, UA: NEGATIVE
KETONES UA: NEGATIVE
LEUKOCYTES UA: NEGATIVE
Nitrite, UA: NEGATIVE
PH UA: 6 (ref 5.0–8.0)
PROTEIN UA: NEGATIVE
Spec Grav, UA: 1.015 (ref 1.010–1.025)
UROBILINOGEN UA: 0.2 U/dL

## 2016-11-07 NOTE — Patient Instructions (Addendum)
Bartholin Cyst or Abscess °A Bartholin cyst is a fluid-filled sac that forms on a Bartholin gland. Bartholin glands are small glands that are found in the folds of skin (labia) on the sides of the lower opening of the vagina. °This type of cyst causes a bulge on the side of the vagina. A cyst that is not large or infected may not cause problems. However, if the fluid in the cyst becomes infected, the cyst can turn into an abscess. An abscess may cause discomfort or pain. °Follow these instructions at home: °· Take medicines only as told by your doctor. °· If you were prescribed an antibiotic medicine, finish all of it even if you start to feel better. °· Apply warm, wet compresses to the area or take warm, shallow baths that cover your pelvic area (sitz baths). Do this many times each day or as told by your doctor. °· Do not squeeze the cyst. Do not apply heavy pressure to it. °· Do not have sex until the cyst has gone away. °· If your cyst or abscess was opened by your doctor, a small piece of gauze or a drain may have been placed in the area. That lets the cyst drain. Do not remove the gauze or the drain until your doctor tells you it is okay to do that. °· Do not wear tampons. Wear feminine pads as needed for any fluid or blood. °· Keep all follow-up visits as told by your doctor. This is important. °Contact a doctor if: °· Your pain, puffiness (swelling), or redness in the area of the cyst gets worse. °· You have fluid or pus pus coming from the cyst. °· You have a fever. °This information is not intended to replace advice given to you by your health care provider. Make sure you discuss any questions you have with your health care provider. °Document Released: 09/29/2008 Document Revised: 12/09/2015 Document Reviewed: 02/16/2014 °Elsevier Interactive Patient Education © 2017 [employer] ° °

## 2016-11-07 NOTE — Progress Notes (Signed)
Subjective:    Patient ID: Andrea Tran, female    DOB: 03/04/1966, 51 y.o.   MRN: [id number]  HPI 51 -year-old female comes in today complaining of a bump that she noticed on the vaginal area this past Friday, approximately 5 days ago. She denies any abnormal vaginal discharge or itching or discomfort. She is using warm washcloths and sitting in a warm tub of water.   Review of Systems  BP (!) 113/56   Pulse 80   Ht 5\' 5"  (1.651 m)   Wt 190 lb (86.2 kg)   SpO2 100%   BMI 31.62 kg/m     No Known Allergies  Past Medical History:  Diagnosis Date  . 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 HEEL SPUR RESECTION     left foot  . HIP SURGERY     left, scar tissue  . KNEE ARTHROSCOPY     right  . LASIK    . shoulder capsule surgery     left    Social History   Social History  . Marital status: Married    Spouse name: Ed  . Number of children: 0   . Years of education: N/A   Occupational History  . Sales Assot    Social History Main Topics  . Smoking status: Never Smoker  . Smokeless tobacco: Never Used  . Alcohol use 0.0 - 0.5 oz/week  . Drug use: No  . 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   Other Topics Concern  . Not on file   Social History Narrative   1 caffeine per day. Plays tennis 2-3 x per week. On step duaghter    Family History  Problem Relation Age of Onset  . Heart attack Maternal Grandmother   . Hyperlipidemia Maternal Grandmother   . Diabetes Maternal Grandmother   . Thyroid disease Maternal Grandmother     hyper  . Heart attack Paternal Grandfather   . Hypertension Father   . Thyroid disease Mother     hypo  . Thyroid disease Maternal Aunt     hypo    Outpatient Encounter Prescriptions as of 11/07/2016  Medication Sig  . acetaminophen (TYLENOL) 650 MG CR tablet Take 1 tablet (650  mg total) by mouth 2 (two) times daily as needed for pain.  Marland Kitchen atovaquone-proguanil (MALARONE) 250-100 MG TABS tablet Take 1 tablet by mouth daily. Start 2 days before travel.  . meloxicam (MOBIC) 15 MG tablet TAKE 1 TABLET BY MOUTH EVERY MORNING WITH BREAKFAST FOR 2 WEEKS THEN AS NEEDED DAILY FOR PAIN  . norgestimate-ethinyl estradiol (MONONESSA) 0.25-35 MG-MCG tablet Take 1 tablet by mouth daily.  . typhoid (VIVOTIF) DR capsule Take 1 capsule by mouth every other day. Make sure to complete medication a week before travel.  . valACYclovir (VALTREX) 500 MG tablet Take 1 tablet (500 mg total) by mouth daily.   No facility-administered encounter medications on file as of 11/07/2016.          Objective:   Physical Exam  Constitutional: She is oriented to person, place, and time. She appears well-developed and well-nourished.  HENT:  Head: Normocephalic and atraumatic.  Eyes: Conjunctivae and EOM are normal.  Cardiovascular: Normal rate.   Pulmonary/Chest: Effort normal.  Genitourinary:     Neurological: She is alert and oriented to person,  place, and time.  Skin: Skin is dry. No pallor.  Psychiatric: She has a normal mood and affect. Her behavior is normal.  Vitals reviewed.         Assessment & Plan:  Bartholin cyst - based On exam I suspect a Bartholin's cyst. This will need to be drained. Right now it's approximately 3 cm in size. We will get her referred her GYN down the hall.  Urinalysis was positive for blood. We'll send for microscopic review for confirmation.

## 2016-11-08 LAB — URINALYSIS, MICROSCOPIC ONLY
Bacteria, UA: NONE SEEN [HPF]
Casts: NONE SEEN [LPF]
Crystals: NONE SEEN [HPF]
Squamous Epithelial / LPF: NONE SEEN [HPF] (ref <=5)
WBC UA: NONE SEEN WBC/HPF (ref <=5)
Yeast: NONE SEEN [HPF]

## 2016-11-09 ENCOUNTER — Ambulatory Visit (INDEPENDENT_AMBULATORY_CARE_PROVIDER_SITE_OTHER): Payer: Commercial Managed Care - PPO | Admitting: Obstetrics & Gynecology

## 2016-11-09 ENCOUNTER — Encounter: Payer: Self-pay | Admitting: Obstetrics & Gynecology

## 2016-11-09 VITALS — BP 120/75 | HR 101 | Ht 65.0 in | Wt 190.0 lb

## 2016-11-09 DIAGNOSIS — N750 Cyst of Bartholin's gland: Secondary | ICD-10-CM

## 2016-11-09 NOTE — Progress Notes (Signed)
   Subjective:    Patient ID: Andrea Tran, female    DOB: 11-Aug-1965, 51 y.o.   MRN: [id number]  HPI  51 yo lady here with a week h/o Bartholin's cyst. She would like an I&D.  Review of Systems     Objective:   Physical Exam 3-4 cm Bartholin's cyst on her right. I prepped the area with betadine and injected 2 cc of 1% lidocaine. I made a small incison. The cyst drained clear fluid.  I used hemostats to make sure any loculations were broken up. I placed a Word catheter. She tolerated the procedure well.     Assessment & Plan:  Right Bartholin's s/p I&D RTC 2 weeks for catheter removal

## 2016-11-13 ENCOUNTER — Encounter: Payer: Self-pay | Admitting: Obstetrics & Gynecology

## 2016-11-23 ENCOUNTER — Ambulatory Visit (INDEPENDENT_AMBULATORY_CARE_PROVIDER_SITE_OTHER): Payer: Commercial Managed Care - PPO | Admitting: Family Medicine

## 2016-11-23 ENCOUNTER — Ambulatory Visit (INDEPENDENT_AMBULATORY_CARE_PROVIDER_SITE_OTHER): Payer: Commercial Managed Care - PPO | Admitting: Obstetrics & Gynecology

## 2016-11-23 ENCOUNTER — Encounter: Payer: Self-pay | Admitting: Obstetrics & Gynecology

## 2016-11-23 VITALS — BP 114/75 | HR 79 | Temp 98.8°F

## 2016-11-23 VITALS — BP 123/80 | HR 84 | Ht 65.0 in | Wt 191.0 lb

## 2016-11-23 DIAGNOSIS — N750 Cyst of Bartholin's gland: Secondary | ICD-10-CM

## 2016-11-23 DIAGNOSIS — Z23 Encounter for immunization: Secondary | ICD-10-CM | POA: Diagnosis not present

## 2016-11-23 NOTE — Progress Notes (Signed)
Will still need a booster in a year.

## 2016-11-23 NOTE — Progress Notes (Signed)
   Subjective:    Patient ID: Andrea Tran, female    DOB: Feb 04, 1966, 51 y.o.   MRN: [id number]  HPI Patient here for final Hep A/B immunization. Denies any problems or complications with other 2 in series.    Review of Systems     Objective:   Physical Exam        Assessment & Plan:  Patient tolerated injection well without complications.

## 2016-11-23 NOTE — Progress Notes (Signed)
Patient here for 3rd of accelerated series Hep A/B. Denies any problems with previous 2 doses.

## 2016-11-23 NOTE — Progress Notes (Signed)
   Subjective:    Patient ID: Andrea Tran, female    DOB: 04-24-1966, 51 y.o.   MRN: [id number]  HPI Here for follow up after I&D for right Bartholin's cyst. She reports that the catheter fell out the day after it was placed. She is not having any problems.   Review of Systems     Objective:   Physical Exam [id number] Breathing, conversing, and ambulating normally Her vulva is entirely healed. No remaining cyst or other abnormalities.       Assessment & Plan:  h/o Bartholin's cyst- now resolved after I&D- doing well RTC for annual/prn sooner

## 2016-11-27 ENCOUNTER — Ambulatory Visit: Payer: Commercial Managed Care - PPO

## 2017-03-12 ENCOUNTER — Other Ambulatory Visit: Payer: Self-pay | Admitting: Family Medicine

## 2017-03-12 DIAGNOSIS — Z1231 Encounter for screening mammogram for malignant neoplasm of breast: Secondary | ICD-10-CM

## 2017-03-22 ENCOUNTER — Ambulatory Visit (HOSPITAL_BASED_OUTPATIENT_CLINIC_OR_DEPARTMENT_OTHER): Payer: Self-pay

## 2017-04-25 ENCOUNTER — Other Ambulatory Visit: Payer: Self-pay | Admitting: Family Medicine

## 2017-04-25 ENCOUNTER — Ambulatory Visit: Payer: Self-pay

## 2017-04-25 ENCOUNTER — Other Ambulatory Visit: Payer: Self-pay | Admitting: *Deleted

## 2017-04-25 DIAGNOSIS — M1712 Unilateral primary osteoarthritis, left knee: Secondary | ICD-10-CM

## 2017-05-09 ENCOUNTER — Ambulatory Visit (INDEPENDENT_AMBULATORY_CARE_PROVIDER_SITE_OTHER): Payer: Commercial Managed Care - PPO

## 2017-05-09 DIAGNOSIS — Z1231 Encounter for screening mammogram for malignant neoplasm of breast: Secondary | ICD-10-CM | POA: Diagnosis not present

## 2017-05-22 ENCOUNTER — Other Ambulatory Visit: Payer: Self-pay | Admitting: Family Medicine

## 2017-05-23 ENCOUNTER — Encounter: Payer: Self-pay | Admitting: Family Medicine

## 2017-05-23 ENCOUNTER — Other Ambulatory Visit (HOSPITAL_COMMUNITY)
Admission: RE | Admit: 2017-05-23 | Discharge: 2017-05-23 | Disposition: A | Discharge status: Home / Self Care | Payer: Commercial Managed Care - PPO | Source: Ambulatory Visit | Attending: Family Medicine | Admitting: Family Medicine

## 2017-05-23 ENCOUNTER — Ambulatory Visit (INDEPENDENT_AMBULATORY_CARE_PROVIDER_SITE_OTHER): Payer: Commercial Managed Care - PPO | Admitting: Family Medicine

## 2017-05-23 VITALS — BP 125/81 | HR 66 | Ht 65.0 in | Wt 179.0 lb

## 2017-05-23 DIAGNOSIS — Z124 Encounter for screening for malignant neoplasm of cervix: Secondary | ICD-10-CM | POA: Insufficient documentation

## 2017-05-23 DIAGNOSIS — Z0000 Encounter for general adult medical examination without abnormal findings: Secondary | ICD-10-CM | POA: Diagnosis not present

## 2017-05-23 DIAGNOSIS — Z23 Encounter for immunization: Secondary | ICD-10-CM

## 2017-05-23 NOTE — Progress Notes (Signed)
Subjective:     Andrea Tran is a 51 y.o. female and is here for a comprehensive physical exam. The patient reports no problems.  She actually came off of her birth control in July.  She has not had a period since.  She is not sure if she is fully menopausal yet or not.  She wanted to know what the cutoff would be for that.  She is not currently exercising.    Social History   Socioeconomic History  . Marital status: Single    Spouse name: Ed  . Number of children: 0   . Years of education: Not on file  . Highest education level: Not on file  Social Needs  . Financial resource strain: Not on file  . Food insecurity - worry: Not on file  . Food insecurity - inability: Not on file  . Transportation needs - medical: Not on file  . Transportation needs - non-medical: Not on file  Occupational History  . Occupation: Sales Assot  Tobacco Use  . Smoking status: Never Smoker  . Smokeless tobacco: Never Used  Substance and Sexual Activity  . Alcohol use: Yes    Alcohol/week: 0.0 - 0.5 oz  . Drug use: No  . 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  Other Topics Concern  . Not on file  Social History Narrative   1 caffeine per day. Plays tennis 2-3 x per week. On step duaghter   Health Maintenance  Topic Date Due  . PAP SMEAR  02/26/2017  . COLONOSCOPY  11/20/2017 (Originally 09/06/2016)  . INFLUENZA VACCINE  10/15/2018 (Originally 02/14/2017)  . TETANUS/TDAP  01/07/2019  . MAMMOGRAM  05/10/2019  . HIV Screening  Completed    The following portions of the patient's history were reviewed and updated as appropriate: allergies, current medications, past family history, past medical history, past social history, past surgical history and problem list.  Review of Systems A comprehensive review of systems was negative.   Objective:    BP 125/81   Pulse 66   Ht 5\' 5"   (1.651 m)   Wt 179 lb (81.2 kg)   LMP 02/08/2017   SpO2 100%   BMI 29.79 kg/m  General appearance: alert, cooperative and appears stated age Head: Normocephalic, without obvious abnormality, atraumatic Eyes: conj claer, EOMI, PEERLA Ears: normal TM's and external ear canals both ears Nose: Nares normal. Septum midline. Mucosa normal. No drainage or sinus tenderness. Throat: lips, mucosa, and tongue normal; teeth and gums normal Neck: no adenopathy, no carotid bruit, no JVD, supple, symmetrical, trachea midline and thyroid not enlarged, symmetric, no tenderness/mass/nodules Back: symmetric, no curvature. ROM normal. No CVA tenderness. Lungs: clear to auscultation bilaterally Breasts: normal appearance, no masses or tenderness Heart: regular rate and rhythm, S1, S2 normal, no murmur, click, rub or gallop Abdomen: soft, non-tender; bowel sounds normal; no masses,  no organomegaly Pelvic: cervix normal in appearance, external genitalia normal, no adnexal masses or tenderness, no cervical motion tenderness, rectovaginal septum normal, uterus normal size, shape, and consistency, vagina normal without discharge and small approx 3 mm polyp at the entrace to the cervix Extremities: extremities normal, atraumatic, no cyanosis or edema Pulses: 2+ and symmetric Skin: Skin color, texture, turgor normal. No rashes or lesions Lymph nodes: Cervical, supraclavicular, and axillary nodes normal. Neurologic: Alert and oriented X 3, normal strength and tone. Normal symmetric reflexes. Normal  coordination and gait    Assessment:    Healthy female exam.      Plan:     See After Visit Summary for Counseling Recommendations   Keep up a regular exercise program and make sure you are eating a healthy diet Try to eat 4 servings of dairy a day, or if you are lactose intolerant take a calcium with vitamin D daily.  Your vaccines are up to date.  Will call with pap results.   Amenorrhea-discussed that  technically by definition no period for 1 year makes her menopausal but if she does not have one over the next 6 months and she is likely menopausal.  She actually plans on scheduling her colonoscopy early next year.

## 2017-05-23 NOTE — Patient Instructions (Addendum)

## 2017-05-24 LAB — COMPLETE METABOLIC PANEL WITH GFR
AG Ratio: 1.8 (calc) (ref 1.0–2.5)
ALBUMIN MSPROF: 4.4 g/dL (ref 3.6–5.1)
ALT: 37 U/L — ABNORMAL HIGH (ref 6–29)
AST: 32 U/L (ref 10–35)
Alkaline phosphatase (APISO): 88 U/L (ref 33–130)
BILIRUBIN TOTAL: 0.6 mg/dL (ref 0.2–1.2)
BUN: 20 mg/dL (ref 7–25)
CALCIUM: 9.4 mg/dL (ref 8.6–10.4)
CO2: 29 mmol/L (ref 20–32)
CREATININE: 0.69 mg/dL (ref 0.50–1.05)
Chloride: 103 mmol/L (ref 98–110)
GFR, EST AFRICAN AMERICAN: 118 mL/min/{1.73_m2} (ref >=60)
GFR, EST NON AFRICAN AMERICAN: 102 mL/min/{1.73_m2} (ref >=60)
GLUCOSE: 91 mg/dL (ref 65–99)
Globulin: 2.5 g/dL (calc) (ref 1.9–3.7)
Potassium: 4.5 mmol/L (ref 3.5–5.3)
Sodium: 138 mmol/L (ref 135–146)
TOTAL PROTEIN: 6.9 g/dL (ref 6.1–8.1)

## 2017-05-24 LAB — LIPID PANEL W/REFLEX DIRECT LDL
Cholesterol: 207 mg/dL — ABNORMAL HIGH (ref <200)
HDL: 87 mg/dL (ref >50)
LDL Cholesterol (Calc): 106 mg/dL (calc) — ABNORMAL HIGH
Non-HDL Cholesterol (Calc): 120 mg/dL (calc) (ref <130)
TRIGLYCERIDES: 57 mg/dL (ref <150)
Total CHOL/HDL Ratio: 2.4 (calc) (ref <5.0)

## 2017-05-24 LAB — CBC
HEMATOCRIT: 41.3 % (ref 35.0–45.0)
HEMOGLOBIN: 14.2 g/dL (ref 11.7–15.5)
MCH: 31.7 pg (ref 27.0–33.0)
MCHC: 34.4 g/dL (ref 32.0–36.0)
MCV: 92.2 fL (ref 80.0–100.0)
MPV: 9.8 fL (ref 7.5–12.5)
Platelets: 335 10*3/uL (ref 140–400)
RBC: 4.48 10*6/uL (ref 3.80–5.10)
RDW: 12.4 % (ref 11.0–15.0)
WBC: 5.1 10*3/uL (ref 3.8–10.8)

## 2017-05-24 LAB — TSH: TSH: 1.53 m[IU]/L

## 2017-05-24 LAB — VITAMIN D 25 HYDROXY (VIT D DEFICIENCY, FRACTURES): Vit D, 25-Hydroxy: 28 ng/mL — ABNORMAL LOW (ref 30–100)

## 2017-05-25 LAB — CYTOLOGY - PAP
Diagnosis: NEGATIVE
HPV: NOT DETECTED

## 2017-05-25 NOTE — Progress Notes (Signed)
Call patient: Your Pap smear is normal. Repeat in 5 years.

## 2017-07-27 ENCOUNTER — Other Ambulatory Visit: Payer: Self-pay | Admitting: *Deleted

## 2017-07-27 DIAGNOSIS — M1712 Unilateral primary osteoarthritis, left knee: Secondary | ICD-10-CM

## 2017-07-27 MED ORDER — VALACYCLOVIR HCL 500 MG PO TABS: ORAL_TABLET | ORAL | 1 refills | Status: DC

## 2017-07-27 MED ORDER — MELOXICAM 15 MG PO TABS: 15.0000 mg | ORAL_TABLET | Freq: Every day | ORAL | 1 refills | Status: DC

## 2017-08-04 IMAGING — MG MM SCREENING BREAST TOMO BILATERAL
9 series · 9 of 25 positions shown · non-contrast
Comparison: Previous exam(s).

CLINICAL DATA: Screening.

EXAM:
DIGITAL SCREENING BILATERAL MAMMOGRAM WITH 3D TOMO WITH CAD

[R CC (1 of 2)]
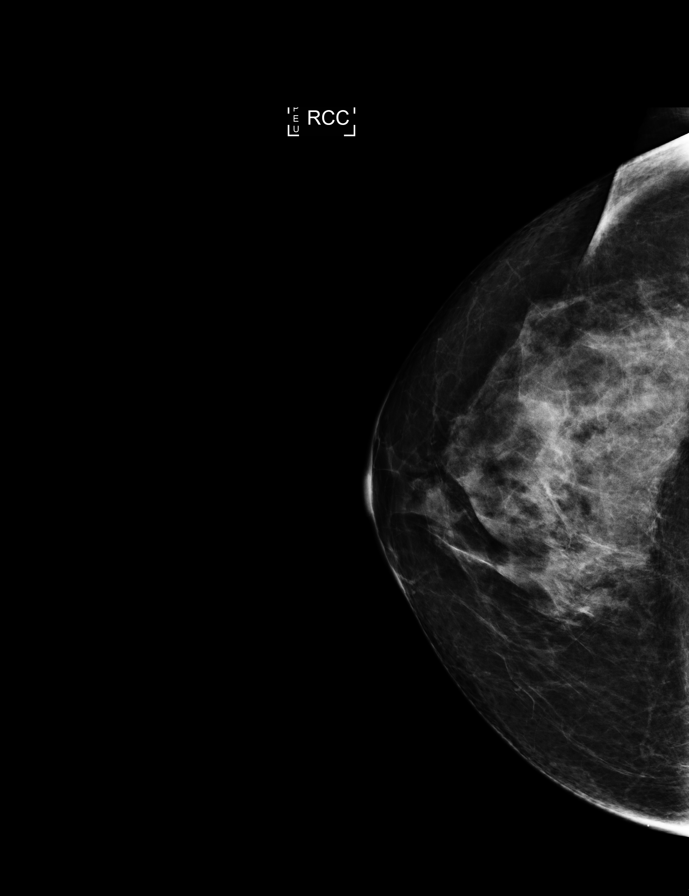

[L CC]
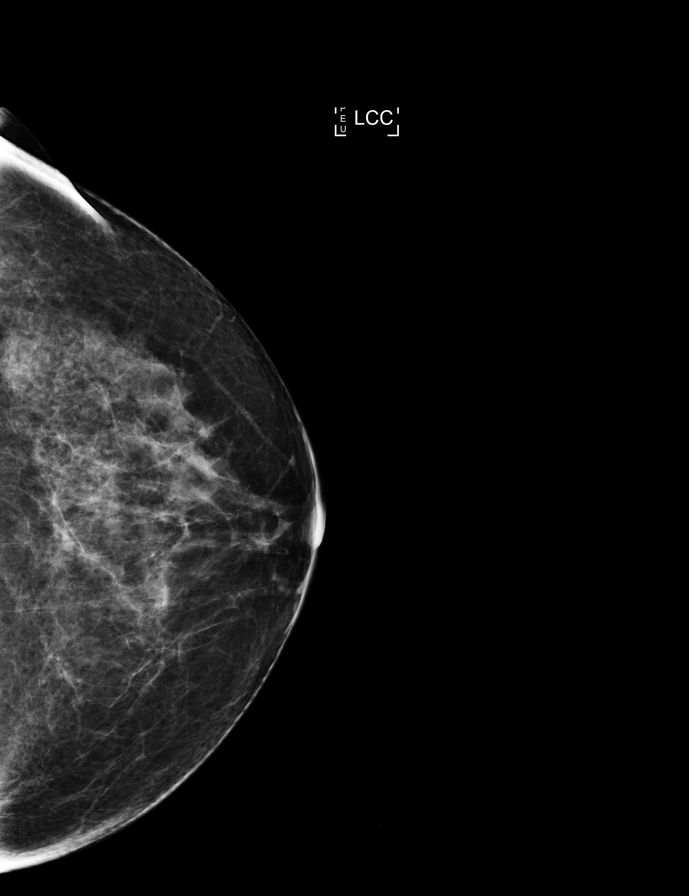

[R MLO]
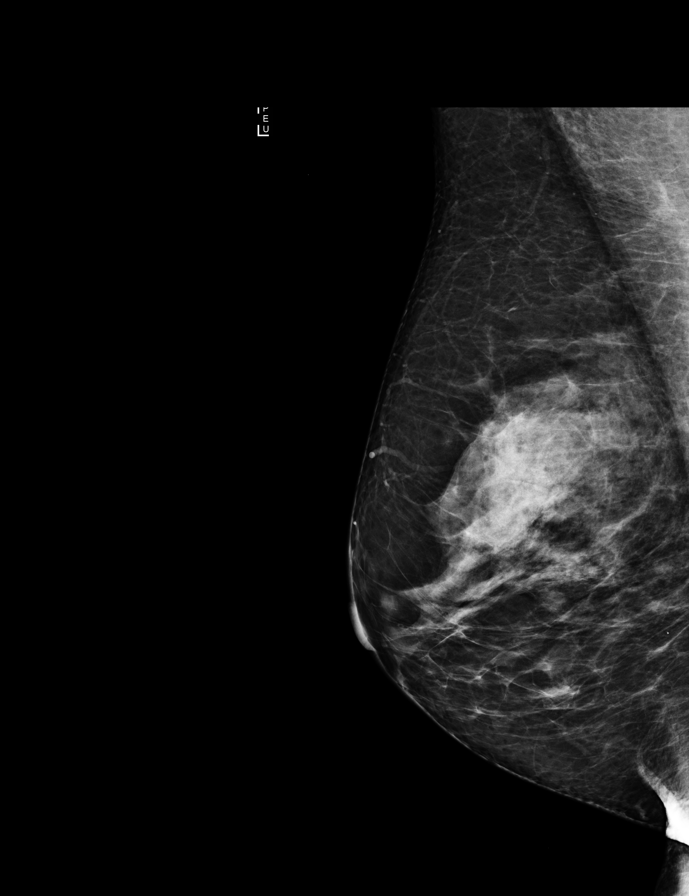

[L MLO]
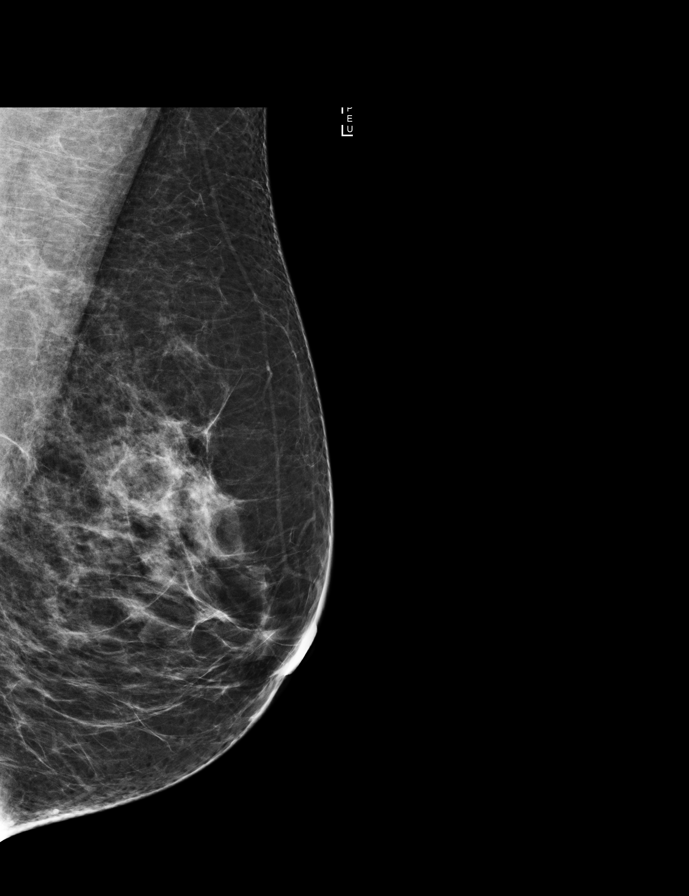

[R CC (2 of 2)]
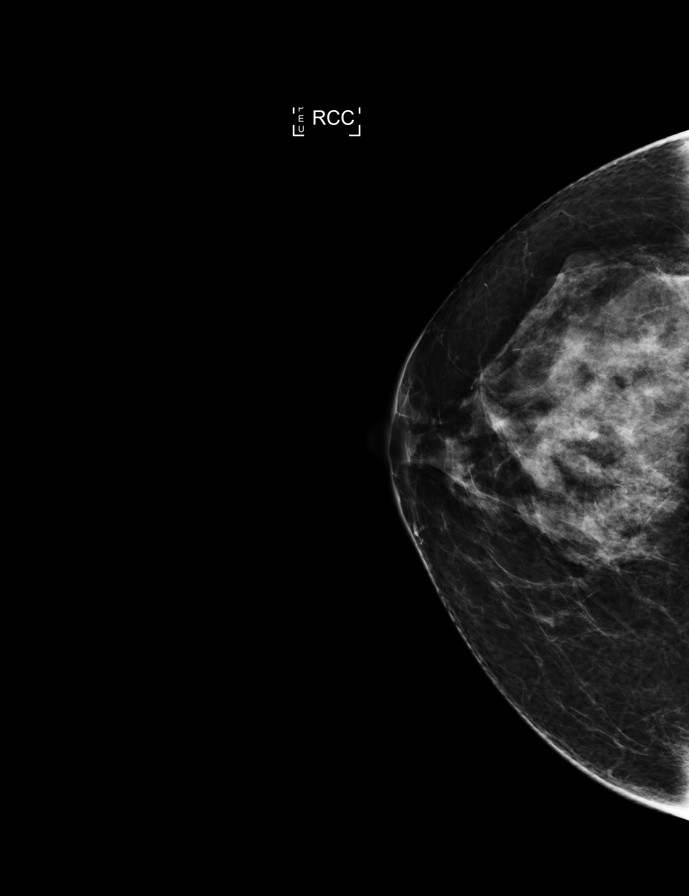

[R MLO tomo · tomo slice 42/83.0]
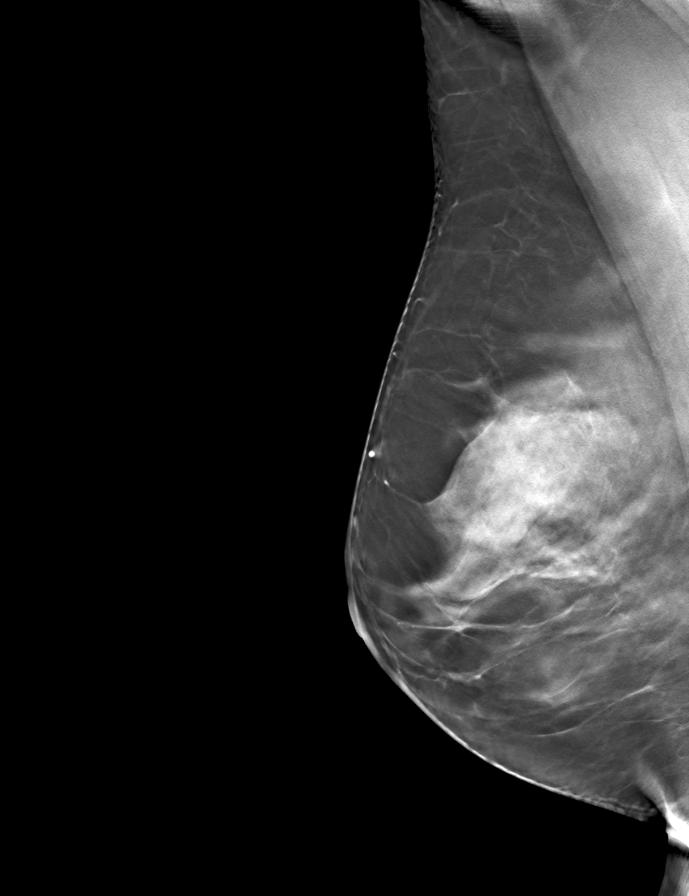

[R CC tomo · tomo slice 40/79.0]
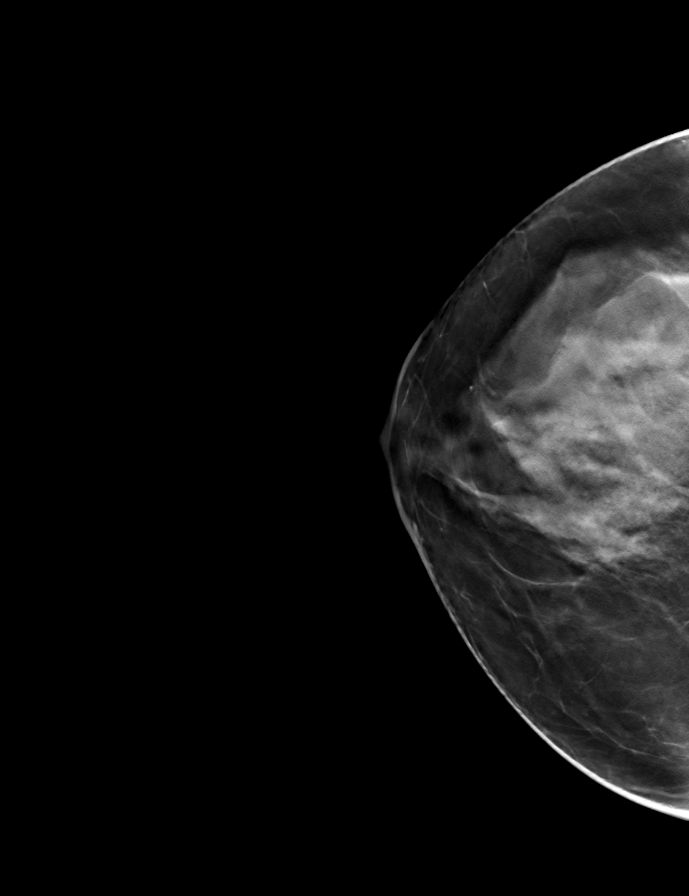

[L MLO tomo · tomo slice 39/78.0]
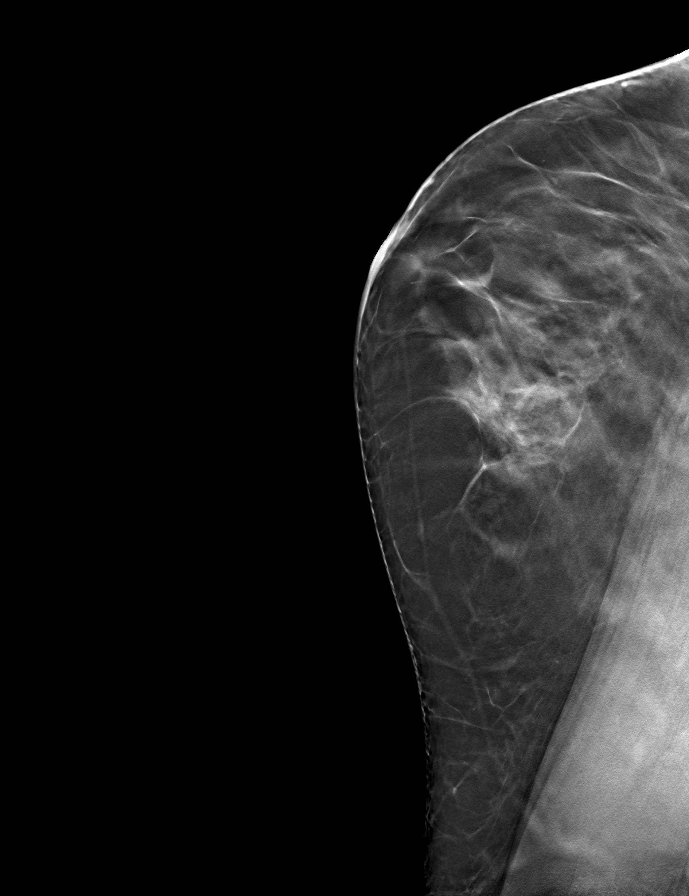

[L CC tomo · tomo slice 45/90.0]
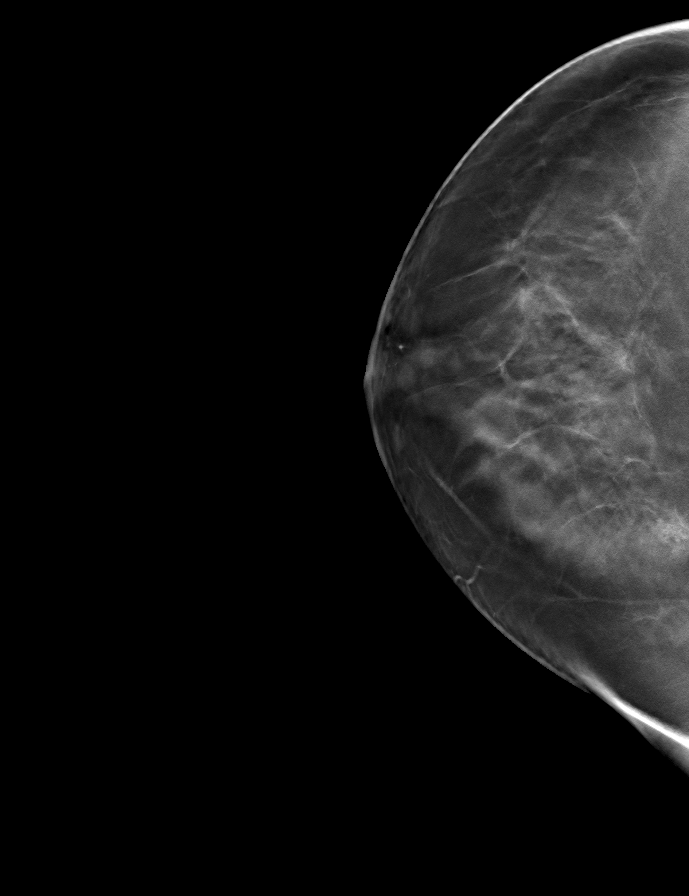

[9 of 25 positions shown; findings below may reference images not displayed]

ACR Breast Density Category c: The breast tissue is heterogeneously
dense, which may obscure small masses.
FINDINGS: There are no findings suspicious for malignancy. Images were
processed with CAD.
IMPRESSION: No mammographic evidence of malignancy. A result letter of this
screening mammogram will be mailed directly to the patient.

RECOMMENDATION:
Screening mammogram in one year. (Code:OA-G-1SS)

BI-RADS CATEGORY  1: Negative.

## 2017-08-12 ENCOUNTER — Emergency Department (INDEPENDENT_AMBULATORY_CARE_PROVIDER_SITE_OTHER)
Admission: EM | Admit: 2017-08-12 | Discharge: 2017-08-12 | Disposition: A | Discharge status: Home / Self Care | Payer: Commercial Managed Care - PPO | Source: Home / Self Care | Attending: Family Medicine | Admitting: Family Medicine

## 2017-08-12 ENCOUNTER — Encounter: Payer: Self-pay | Admitting: Emergency Medicine

## 2017-08-12 DIAGNOSIS — J3489 Other specified disorders of nose and nasal sinuses: Secondary | ICD-10-CM | POA: Diagnosis not present

## 2017-08-12 DIAGNOSIS — H9201 Otalgia, right ear: Secondary | ICD-10-CM | POA: Diagnosis not present

## 2017-08-12 MED ORDER — PREDNISONE 20 MG PO TABS: ORAL_TABLET | ORAL | 0 refills | Status: DC

## 2017-08-12 MED ORDER — FLUTICASONE PROPIONATE 50 MCG/ACT NA SUSP: 2.0000 | Freq: Every day | NASAL | 2 refills | Status: DC

## 2017-08-12 NOTE — ED Provider Notes (Signed)
[facility]    CSN: [id number] Arrival date & time: 08/12/17  1249     History   Chief Complaint Chief Complaint  Patient presents with  . Otalgia    HPI Andrea Tran is a 52 y.o. female.   HPI  Andrea Tran is a 52 y.o. female presenting to UC with c/o Right ear pain for 4 days with associated mild clear nasal discharge, and Right sided facial pressure.  She reports having a crown placed on her Right upper molar about 1 month ago.  That pain gradually resolved but now her Right ear is sore. 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 No increased stress recently.  She takes Meloxicam daily for knee pain.  Pain is ear is aching and sore, 3/10.  Denies fever, chills, n/v/d. No sore throat or cough.   Past Medical History:  Diagnosis Date  . 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 Primary osteoarthritis of left knee 05/23/2016  . Genital herpes 02/10/2013  . HEMORRHOIDS 06/03/2010  . NONSPEC ELEVATION OF LEVELS OF TRANSAMINASE/LDH 01/07/2009    Past Surgical History:  Procedure Laterality Date  . HEEL SPUR RESECTION     left foot  . HIP SURGERY     left, scar tissue  . KNEE ARTHROSCOPY     right  . LASIK    . shoulder capsule surgery     left    OB History    Gravida Para Term Preterm AB Living   0 0 0 0 0 0   SAB TAB Ectopic Multiple Live Births   0 0 0 0 0       Home Medications    Prior to Admission medications   Medication Sig Start Date End Date Taking? Authorizing Provider  fluticasone (FLONASE) 50 MCG/ACT nasal spray Place 2 sprays into both nostrils daily. 08/12/17   Noe Gens, PA-C  meloxicam (MOBIC) 15 MG tablet Take 1 tablet (15 mg total) by mouth daily. TAKE 1 TABLET BY MOUTH EVERY MORNING WITH BREAKFAST FOR 2 WEEKS THEN AS NEEDED DAILY FOR PAIN 07/27/17   Hali Marry, MD  predniSONE (DELTASONE) 20 MG tablet 3 tabs po day one, then 2 po daily x 4 days 08/12/17   Noe Gens, PA-C  valACYclovir (VALTREX) 500 MG tablet TAKE 1 TABLET BY MOUTH ONCE DAILY 07/27/17   Hali Marry, MD    Family History Family History  Problem Relation Age of Onset  . Heart attack Maternal Grandmother   . Hyperlipidemia Maternal Grandmother   . Diabetes Maternal Grandmother   . Thyroid disease Maternal Grandmother        hyper  . Heart attack Paternal Grandfather   . Hypertension Father   . Thyroid disease Mother        hypo  . Thyroid disease Maternal Aunt        hypo    Social History Social History   Tobacco Use  . Smoking status: Never Smoker  . Smokeless tobacco: Never Used  Substance Use Topics  . Alcohol use: Yes    Alcohol/week: 0.0 - 0.5 oz  . Drug use: No     Allergies   Banana   Review of Systems Review of Systems  Constitutional: Negative for chills and fever.  HENT: Positive for congestion (minimal), ear pain (Right) and sinus pressure (Right side). Negative for sinus pain, sore throat,  trouble swallowing and voice change.   Respiratory: Negative for cough and shortness of breath.   Cardiovascular: Negative for chest pain and palpitations.  Gastrointestinal: Negative for abdominal pain, diarrhea, nausea and vomiting.  Musculoskeletal: Negative for arthralgias, back pain and myalgias.  Skin: Negative for rash.     Physical Exam Triage Vital Signs ED Triage Vitals  Enc Vitals Group     BP 08/12/17 1259 116/75     Pulse Rate 08/12/17 1259 79     Resp --      Temp 08/12/17 1259 98.1 F (36.7 C)     Temp Source 08/12/17 1259 Oral     SpO2 08/12/17 1259 99 %     Weight 08/12/17 1300 189 lb (85.7 kg)     Height 08/12/17 1300 5\' 5"  (1.651 m)     Head Circumference --      Peak Flow --      Pain Score 08/12/17 1300 3     Pain Loc --      Pain Edu? --      Excl. in [place]? --    No data found.  Updated Vital Signs BP 116/75 (BP Location: Right Arm)   Pulse 79   Temp 98.1 F (36.7 C) (Oral)   Ht 5\' 5"  (1.651 m)   Wt 189  lb (85.7 kg)   LMP 07/14/2017 (Exact Date)   SpO2 99%   BMI 31.45 kg/m   Visual Acuity Right Eye Distance:   Left Eye Distance:   Bilateral Distance:    Right Eye Near:   Left Eye Near:    Bilateral Near:     Physical Exam  Constitutional: She is oriented to person, place, and time. She appears well-developed and well-nourished. No distress.  HENT:  Head: Normocephalic and atraumatic.  Right Ear: No drainage, swelling or tenderness. Tympanic membrane is not erythematous and not bulging. A middle ear effusion is present.  Left Ear: Tympanic membrane normal.  Nose: Nose normal. Right sinus exhibits no maxillary sinus tenderness and no frontal sinus tenderness. Left sinus exhibits no maxillary sinus tenderness and no frontal sinus tenderness.  Mouth/Throat: Uvula is midline, oropharynx is clear and moist and mucous membranes are normal.  Crown on Right upper molar, non-tender. No gingival edema or erythema.  Mild crepitus with opening and closing jaw (chronic per pt) no tenderness over TMJ  Eyes: EOM are normal.  Neck: Normal range of motion.  Cardiovascular: Normal rate.  Pulmonary/Chest: Effort normal.  Musculoskeletal: Normal range of motion.  Neurological: She is alert and oriented to person, place, and time.  Skin: Skin is warm and dry. She is not diaphoretic.  Psychiatric: She has a normal mood and affect. Her behavior is normal.  Nursing note and vitals reviewed.    UC Treatments / Results  Labs (all labs ordered are listed, but only abnormal results are displayed) Labs Reviewed - No data to display  EKG  EKG Interpretation None       Radiology No results found.  Procedures Procedures (including critical care time)  Medications Ordered in UC Medications - No data to display  Tympanometry: Normal in Left and Right eat   Initial Impression / Assessment and Plan / UC Course  I have reviewed the triage vital signs and the nursing notes.  Pertinent labs &  imaging results that were available during my care of the patient were reviewed by me and considered in my medical decision making (see chart for details).  No evidence of bacterial infection at this time. Will tx symptomatically with prednisone and flonase F/u with PCP in 1 week if not improving.   Final Clinical Impressions(s) / UC Diagnoses   Final diagnoses:  Acute otalgia, right  Sinus pressure    ED Discharge Orders        Ordered    predniSONE (DELTASONE) 20 MG tablet     08/12/17 1401    fluticasone (FLONASE) 50 MCG/ACT nasal spray  Daily     08/12/17 1401       Controlled Substance Prescriptions [place] Controlled Substance Registry consulted? Not Applicable   Noe Gens, PA-C 08/12/17 1440

## 2017-08-12 NOTE — ED Triage Notes (Signed)
Patient complaining of right ear pain x 4 days, some nasal drainage, right sided facial pain.

## 2017-09-19 ENCOUNTER — Encounter: Payer: Self-pay | Admitting: Family Medicine

## 2017-09-20 ENCOUNTER — Ambulatory Visit: Payer: Self-pay

## 2017-09-21 ENCOUNTER — Other Ambulatory Visit: Payer: Self-pay | Admitting: *Deleted

## 2017-09-21 ENCOUNTER — Ambulatory Visit (INDEPENDENT_AMBULATORY_CARE_PROVIDER_SITE_OTHER): Payer: Commercial Managed Care - PPO | Admitting: Family Medicine

## 2017-09-21 ENCOUNTER — Encounter: Payer: Self-pay | Admitting: Family Medicine

## 2017-09-21 VITALS — BP 126/59 | HR 71 | Temp 98.8°F

## 2017-09-21 DIAGNOSIS — Z23 Encounter for immunization: Secondary | ICD-10-CM

## 2017-09-21 DIAGNOSIS — R7401 Elevation of levels of liver transaminase levels: Secondary | ICD-10-CM

## 2017-09-21 DIAGNOSIS — R740 Nonspecific elevation of levels of transaminase and lactic acid dehydrogenase [LDH]: Principal | ICD-10-CM

## 2017-09-21 DIAGNOSIS — R7989 Other specified abnormal findings of blood chemistry: Secondary | ICD-10-CM

## 2017-09-21 NOTE — Progress Notes (Signed)
Pt here for second shringrix vaccine. Pt tolerated last injection well. No adverse reaction. 2nd injection administered today. Lab order was given to patient for recheck on hepatic function & vit d level.

## 2017-09-21 NOTE — Progress Notes (Signed)
Agree with documentation as above.   Blessin Kanno, MD  

## 2017-09-22 LAB — HEPATIC FUNCTION PANEL
AG Ratio: 1.7 (calc) (ref 1.0–2.5)
ALKALINE PHOSPHATASE (APISO): 82 U/L (ref 33–130)
ALT: 19 U/L (ref 6–29)
AST: 17 U/L (ref 10–35)
Albumin: 4.1 g/dL (ref 3.6–5.1)
BILIRUBIN INDIRECT: 0.4 mg/dL (ref 0.2–1.2)
Bilirubin, Direct: 0.1 mg/dL (ref 0.0–0.2)
GLOBULIN: 2.4 g/dL (ref 1.9–3.7)
TOTAL PROTEIN: 6.5 g/dL (ref 6.1–8.1)
Total Bilirubin: 0.5 mg/dL (ref 0.2–1.2)

## 2017-09-22 LAB — VITAMIN D 25 HYDROXY (VIT D DEFICIENCY, FRACTURES): Vit D, 25-Hydroxy: 29 ng/mL — ABNORMAL LOW (ref 30–100)

## 2017-09-24 ENCOUNTER — Other Ambulatory Visit: Payer: Self-pay | Admitting: *Deleted

## 2017-09-24 DIAGNOSIS — E559 Vitamin D deficiency, unspecified: Secondary | ICD-10-CM

## 2017-12-25 ENCOUNTER — Encounter: Payer: Self-pay | Admitting: Sports Medicine

## 2017-12-25 ENCOUNTER — Ambulatory Visit (INDEPENDENT_AMBULATORY_CARE_PROVIDER_SITE_OTHER): Payer: Commercial Managed Care - PPO | Admitting: Sports Medicine

## 2017-12-25 DIAGNOSIS — M1712 Unilateral primary osteoarthritis, left knee: Secondary | ICD-10-CM

## 2017-12-25 NOTE — Assessment & Plan Note (Signed)
Slight increase in pain, continue meloxicam, she can do arthritis strength Tylenol for breakthrough pain. Rehab exercises given, continue icing, compression sleeve. Return to see me in 4 to 6 weeks, injection if no better.

## 2017-12-25 NOTE — Progress Notes (Signed)
Subjective:    CC: knee pain  HPI: Andrea Tran, a 52yo female w. pmh s/o L. OA, is presenting in clinic today with L. knee pain. Pt reports that on Thursday she was walking up the stairs at a movie theatre and felt a pulling motion posterior to her popliteal.  After the incident pt reports a dull aching soreness in her left knee  that peaks at 7-8/10 with activity and is alleviated with rest. PT reports pain is nonradiating.  (moderate, not persistent).  Pt reports that icing twice a day has helped to alleviate symptoms of pain. Pt also reports symptoms of stiffness when sitting still.    I reviewed the past medical history, family history, social history, surgical history, and allergies today and no changes were needed.  Please see the problem list section below in epic for further details.  Past Medical History: Past Medical History:  Diagnosis Date  . 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 HEEL SPUR RESECTION     left foot  . HIP SURGERY     left, scar tissue  . KNEE ARTHROSCOPY     right  . LASIK    . shoulder capsule surgery     left   Social History: Social History   Socioeconomic History  . Marital status: Single    Spouse name: Ed  . Number of children: 0   . Years of education: Not on file  . Highest education level: Not on file  Occupational History  . Occupation: Engineer, petroleum  Social Needs  . Financial resource strain: Not on file  . Food insecurity:    Worry: Not on file    Inability: Not on file  . Transportation needs:    Medical: Not on file    Non-medical: Not on file  Tobacco Use  . Smoking status: Never Smoker  . Smokeless tobacco: Never Used  Substance and Sexual Activity  . Alcohol use: Yes    Alcohol/week: 0.0 - 0.5 oz  . Drug use: No  . Sexual activity: Yes    Partners: Male    Comment: sales asst [name], BS biology, married, one Psychiatrist, first husband deceased, reg  exercise.  Lifestyle  . Physical activity:    Days per week: Not on file    Minutes per session: Not on file  . Stress: Not on file  Relationships  . Social connections:    Talks on phone: Not on file    Gets together: Not on file    Attends religious service: Not on file    Active member of club or organization: Not on file    Attends meetings of clubs or organizations: Not on file    Relationship status: Not on file  Other Topics Concern  . Not on file  Social History Narrative   1 caffeine per day. Plays tennis 2-3 x per week. On step duaghter   Family History: Family History  Problem Relation Age of Onset  . Heart attack Maternal Grandmother   . Hyperlipidemia Maternal Grandmother   . Diabetes Maternal Grandmother   . Thyroid disease Maternal Grandmother        hyper  . Heart attack Paternal Grandfather   . Hypertension Father   . Thyroid disease Mother        hypo  . Thyroid disease Maternal Aunt        hypo   Allergies: Allergies  Allergen Reactions  .  Banana Swelling    Blisters in mouth   Medications: See med rec.  Review of Systems: No fevers, chills, night sweats, weight loss, chest pain, or shortness of breath.   Objective:    General: Well Developed, well nourished, and in no acute distress.  Neuro: Alert and oriented x3, extra-ocular muscles intact, sensation grossly intact.  HEENT: Normocephalic, atraumatic, pupils equal round reactive to light, neck supple, no masses, no lymphadenopathy, thyroid nonpalpable.  Skin: Warm and dry, no rashes. Cardiac: Regular rate and rhythm, no murmurs rubs or gallops, no lower extremity edema.  Respiratory: Clear to auscultation bilaterally. Not using accessory muscles, speaking in full sentences.  Left Knee: Normal to inspection with  No erythema and mild effusion but no obvious bony abnormalities. Palpation normal with no warmth, patellar tenderness, or condyle tenderness, but some medial  joint line  tenderness, ROM full in flexion and extension and lower leg rotation. Ligaments with solid consistent endpoints including ACL, PCL, LCL, MCL. Negative Mcmurray's, but slight discomfort felt during the manuever Non painful patellar compression. Patellar glide without crepitus. Patellar and quadriceps tendons unremarkable. Hamstring and quadriceps strength is normal.    Impression and Recommendations:    No problem-specific Assessment & Plan notes found for this encounter.  Assessment- L. Knee OA Pt showed significant alleviation of symptoms of pain to L. knee (secondary to chronic oa) in a previous exacerbation, so shared decision making occurred and patient decided to pursue conservative measures this time as well. Pt was advised to utilize otc compressive knee sleeve for stabilization and compression and protection of joint. Pt was advised to also provide relative rest to the joint and to elevate knee when possible.   Pt was advised to continue icing throughout the day.   Pt was advised to take Tylenol for breakthrough pain (in addition to current regimen of meloxicam qd. Pt was provided  home rehab exercises for knee oa and advised to complete nightly. Xray imaging is not indicated at this time but pt was advised to follow up as needed and counseled on next steps which include us-guided injection of the L. knee joint capsule, and updated imaging (xray, mri for further evaluation and orthovisc injection series).   ___________________________________________ Gwen Her. Dianah Field, M.D., ABFM., CAQSM. Primary Care and [place] Instructor of [facility]

## 2018-02-02 ENCOUNTER — Other Ambulatory Visit: Payer: Self-pay | Admitting: Family Medicine

## 2018-02-02 DIAGNOSIS — M1712 Unilateral primary osteoarthritis, left knee: Secondary | ICD-10-CM

## 2018-03-25 ENCOUNTER — Encounter: Payer: Self-pay | Admitting: Family Medicine

## 2018-03-26 ENCOUNTER — Encounter: Payer: Self-pay | Admitting: Family Medicine

## 2018-03-26 ENCOUNTER — Ambulatory Visit (INDEPENDENT_AMBULATORY_CARE_PROVIDER_SITE_OTHER): Payer: Commercial Managed Care - PPO | Admitting: Family Medicine

## 2018-03-26 VITALS — BP 115/67 | HR 73 | Ht 65.0 in | Wt 181.0 lb

## 2018-03-26 DIAGNOSIS — E559 Vitamin D deficiency, unspecified: Secondary | ICD-10-CM

## 2018-03-26 DIAGNOSIS — R21 Rash and other nonspecific skin eruption: Secondary | ICD-10-CM | POA: Diagnosis not present

## 2018-03-26 DIAGNOSIS — N951 Menopausal and female climacteric states: Secondary | ICD-10-CM | POA: Diagnosis not present

## 2018-03-26 MED ORDER — TRIAMCINOLONE ACETONIDE 0.1 % EX CREA: 1.0000 "application " | TOPICAL_CREAM | Freq: Two times a day (BID) | CUTANEOUS | 0 refills | Status: DC

## 2018-03-26 NOTE — Progress Notes (Signed)
Subjective:    Patient ID: Andrea Tran, female    DOB: [birth year], 52 y.o.   MRN: [id number]  HPI 52 year old female comes in today complaining of a rash under her left eye that started 2 weeks ago.  It is under outter corner of L eyelid x 2 wks, she used cortisone cream on this over the w/e to help with the itching, she reports no changes in vision, soaps, detergents,makeup.  He stopped the hydrocortisone yesterday because she was worried she should not use it and made an appointment.  She is also due to have her vitamin D rechecked.  Last checked her level in March and her level was 29.  I had her increase her vitamin D 2000 IU and she is also tried to get out in the sun a little bit over the summertime.  She also feels like she is perimenopausal.  She is still having an occasional.  But they have become more irregular.  She is started some over-the-counter supplements similar to [place].  She is also noticed change in mood.  She says she feels much more emotional and that is not like her at all.  She is only getting 4 to 5 hours of sleep most nights as well so she is expensing some insomnia.  As well as feeling hot and having hot flashes.  She started sleeping with a temperature little cooler and with a fan blowing on her.   Review of Systems  BP 115/67   Pulse 73   Ht 5\' 5"  (1.651 m)   Wt 181 lb (82.1 kg)   SpO2 100%   BMI 30.12 kg/m     Allergies  Allergen Reactions  . Banana Swelling    Blisters in mouth    Past Medical History:  Diagnosis Date  . 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 HEEL SPUR RESECTION     left foot  . HIP SURGERY     left, scar tissue  . KNEE ARTHROSCOPY     right  . LASIK    . shoulder capsule surgery     left    Social History   Socioeconomic History  . Marital status: Single    Spouse name: Ed  . Number of children: 0   . Years of education: Not on file  . Highest education level: Not on file   Occupational History  . Occupation: Engineer, petroleum  Social Needs  . Financial resource strain: Not on file  . Food insecurity:    Worry: Not on file    Inability: Not on file  . Transportation needs:    Medical: Not on file    Non-medical: Not on file  Tobacco Use  . Smoking status: Never Smoker  . Smokeless tobacco: Never Used  Substance and Sexual Activity  . Alcohol use: Yes    Alcohol/week: 0.0 - 1.0 standard drinks  . Drug use: No  . 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  Lifestyle  . Physical activity:    Days per week: Not on file    Minutes per session: Not on file  . Stress: Not on file  Relationships  . Social connections:    Talks on phone: Not on file    Gets together: Not on file    Attends religious service: Not on file  Active member of club or organization: Not on file    Attends meetings of clubs or organizations: Not on file    Relationship status: Not on file  . Intimate partner violence:    Fear of current or ex partner: Not on file    Emotionally abused: Not on file    Physically abused: Not on file    Forced sexual activity: Not on file  Other Topics Concern  . Not on file  Social History Narrative   1 caffeine per day. Plays tennis 2-3 x per week. On step duaghter    Family History  Problem Relation Age of Onset  . Heart attack Maternal Grandmother   . Hyperlipidemia Maternal Grandmother   . Diabetes Maternal Grandmother   . Thyroid disease Maternal Grandmother        hyper  . Heart attack Paternal Grandfather   . Hypertension Father   . Thyroid disease Mother        hypo  . Thyroid disease Maternal Aunt        hypo    Outpatient Encounter Medications as of 03/26/2018  Medication Sig  . fluticasone (FLONASE) 50 MCG/ACT nasal spray Place 2 sprays into both nostrils daily.  . meloxicam (MOBIC) 15 MG tablet TAKE 1 TABLET  BY MOUTH  EVERY MORNING WITH  BREAKFAST FOR 2 WEEKS THEN  AS NEEDED DAILY FOR PAIN  . valACYclovir (VALTREX) 500 MG tablet TAKE 1 TABLET BY MOUTH ONCE DAILY  . triamcinolone cream (KENALOG) 0.1 % Apply 1 application topically 2 (two) times daily.   No facility-administered encounter medications on file as of 03/26/2018.          Objective:   Physical Exam  Constitutional: She is oriented to person, place, and time. She appears well-developed and well-nourished.  HENT:  Head: Normocephalic and atraumatic.  Eyes: Conjunctivae and EOM are normal. Right eye exhibits no discharge. Left eye exhibits no discharge.  Cardiovascular: Normal rate.  Pulmonary/Chest: Effort normal.  Neurological: She is alert and oriented to person, place, and time.  Skin: Skin is dry. No pallor.  To her left eye she has just a slightly erythematous maculopapular rash.  Right now is well moisturized so I do not see any scaling.  No lid edema or streaking erythema.  No discharge from the eye.  The conjunctiva are clear.  Psychiatric: She has a normal mood and affect. Her behavior is normal.  Vitals reviewed.       Assessment & Plan:  Rash-most consistent with a contact dermatitis..  The unclear what may have actually triggered it and its just around the base of the left eye.  Okay to use over-the-counter hydrocortisone if not improving after 1 week then switch to the slightly stronger hydrocortisone them to send over to her pharmacy.  And if at that point is not improving after 1 week she will give me a call back and we will refer her to dermatology.   Perimenopausal-based on her symptoms of mood change, insomnia, hot flashes etc. I do suspect she likely is perimenopausal.  For right now I do think she should try some of the over-the-counter supplements which can be helpful for mild to moderate symptoms.  If at any point they worsen or she feels like the medication is ineffective we can always discuss hormonal  replacement therapy versus nonhormonal treatment for some of her symptoms.  Vitamin D deficiency-to recheck levels.  Lab slip provided.

## 2018-03-26 NOTE — Patient Instructions (Signed)
Continue with over the counter hydrocortisone for 1 week.  If the rashes not improving or just not completely resolved and okay to switch to the prescription hydrocortisone.

## 2018-03-27 LAB — VITAMIN D 25 HYDROXY (VIT D DEFICIENCY, FRACTURES): Vit D, 25-Hydroxy: 34 ng/mL (ref 30–100)

## 2018-04-01 ENCOUNTER — Other Ambulatory Visit: Payer: Self-pay | Admitting: Family Medicine

## 2018-04-01 DIAGNOSIS — M1712 Unilateral primary osteoarthritis, left knee: Secondary | ICD-10-CM

## 2018-05-16 ENCOUNTER — Telehealth: Payer: Self-pay | Admitting: Family Medicine

## 2018-05-16 ENCOUNTER — Other Ambulatory Visit: Payer: Self-pay | Admitting: Family Medicine

## 2018-05-16 DIAGNOSIS — Z0000 Encounter for general adult medical examination without abnormal findings: Secondary | ICD-10-CM

## 2018-05-16 DIAGNOSIS — Z1231 Encounter for screening mammogram for malignant neoplasm of breast: Secondary | ICD-10-CM

## 2018-05-16 NOTE — Telephone Encounter (Signed)
Left VM with status update.  

## 2018-05-16 NOTE — Telephone Encounter (Signed)
Labs entered and printed.  Patient can go to lab at her convenience.

## 2018-05-16 NOTE — Telephone Encounter (Signed)
Pt called because she has her CPE scheduled on 05/28/18 and would like to get her labs done before her appt time. She request that we send her lab orders to lab and nurse call her back and let her know this has been completed and its okay for her to go to lab

## 2018-05-23 LAB — COMPREHENSIVE METABOLIC PANEL
AG RATIO: 1.8 (calc) (ref 1.0–2.5)
ALBUMIN MSPROF: 4.3 g/dL (ref 3.6–5.1)
ALKALINE PHOSPHATASE (APISO): 91 U/L (ref 33–130)
ALT: 27 U/L (ref 6–29)
AST: 22 U/L (ref 10–35)
BUN: 22 mg/dL (ref 7–25)
CO2: 30 mmol/L (ref 20–32)
Calcium: 9.2 mg/dL (ref 8.6–10.4)
Chloride: 104 mmol/L (ref 98–110)
Creat: 0.8 mg/dL (ref 0.50–1.05)
Globulin: 2.4 g/dL (calc) (ref 1.9–3.7)
Glucose, Bld: 86 mg/dL (ref 65–99)
POTASSIUM: 4.5 mmol/L (ref 3.5–5.3)
Sodium: 140 mmol/L (ref 135–146)
Total Bilirubin: 0.5 mg/dL (ref 0.2–1.2)
Total Protein: 6.7 g/dL (ref 6.1–8.1)

## 2018-05-23 LAB — TSH: TSH: 1.51 mIU/L

## 2018-05-23 LAB — CBC
HCT: 38.6 % (ref 35.0–45.0)
Hemoglobin: 12.7 g/dL (ref 11.7–15.5)
MCH: 29.5 pg (ref 27.0–33.0)
MCHC: 32.9 g/dL (ref 32.0–36.0)
MCV: 89.6 fL (ref 80.0–100.0)
MPV: 9.8 fL (ref 7.5–12.5)
PLATELETS: 356 10*3/uL (ref 140–400)
RBC: 4.31 10*6/uL (ref 3.80–5.10)
RDW: 12.8 % (ref 11.0–15.0)
WBC: 4.3 10*3/uL (ref 3.8–10.8)

## 2018-05-23 LAB — LIPID PANEL
CHOL/HDL RATIO: 2.7 (calc) (ref <5.0)
CHOLESTEROL: 198 mg/dL (ref <200)
HDL: 73 mg/dL (ref >50)
LDL Cholesterol (Calc): 111 mg/dL (calc) — ABNORMAL HIGH
NON-HDL CHOLESTEROL (CALC): 125 mg/dL (ref <130)
Triglycerides: 57 mg/dL (ref <150)

## 2018-05-28 ENCOUNTER — Ambulatory Visit (INDEPENDENT_AMBULATORY_CARE_PROVIDER_SITE_OTHER): Payer: Commercial Managed Care - PPO | Admitting: Family Medicine

## 2018-05-28 ENCOUNTER — Encounter: Payer: Self-pay | Admitting: Family Medicine

## 2018-05-28 VITALS — BP 127/65 | HR 79 | Ht 63.78 in | Wt 181.0 lb

## 2018-05-28 DIAGNOSIS — Z6831 Body mass index (BMI) 31.0-31.9, adult: Secondary | ICD-10-CM | POA: Diagnosis not present

## 2018-05-28 DIAGNOSIS — Z0000 Encounter for general adult medical examination without abnormal findings: Secondary | ICD-10-CM

## 2018-05-28 NOTE — Progress Notes (Signed)
Subjective:     Andrea Tran is a 52 y.o. female and is here for a comprehensive physical exam. The patient reports no problems.  He recently just joined the gym and says she is Artie done to [employer] and actually took a yoga class this morning.  She would like to know what her goal weight is.  Would also like a recommendation for a moisturizer for the rash right around her eye.  It does seem to be better but is not completely resolved it seems to come and go.  Reports that she is planning on doing her colonoscopy before the end of the year.  She Artie has her mammogram scheduled for December.  Social History   Socioeconomic History  . Marital status: Single    Spouse name: Ed  . Number of children: 0   . Years of education: Not on file  . Highest education level: Not on file  Occupational History  . Occupation: Engineer, petroleum  Social Needs  . Financial resource strain: Not on file  . Food insecurity:    Worry: Not on file    Inability: Not on file  . Transportation needs:    Medical: Not on file    Non-medical: Not on file  Tobacco Use  . Smoking status: Never Smoker  . Smokeless tobacco: Never Used  Substance and Sexual Activity  . Alcohol use: Yes    Alcohol/week: 0.0 - 1.0 standard drinks  . Drug use: No  . 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  Lifestyle  . Physical activity:    Days per week: Not on file    Minutes per session: Not on file  . Stress: Not on file  Relationships  . Social connections:    Talks on phone: Not on file    Gets together: Not on file    Attends religious service: Not on file    Active member of club or organization: Not on file    Attends meetings of clubs or organizations: Not on file    Relationship status: Not on file  . Intimate partner violence:    Fear of current or ex partner: Not on file    Emotionally  abused: Not on file    Physically abused: Not on file    Forced sexual activity: Not on file  Other Topics Concern  . Not on file  Social History Narrative   1 caffeine per day. Plays tennis 2-3 x per week. On step duaghter   Health Maintenance  Topic Date Due  . COLONOSCOPY  09/06/2016  . INFLUENZA VACCINE  10/15/2018 (Originally 02/14/2018)  . TETANUS/TDAP  01/07/2019  . MAMMOGRAM  05/10/2019  . PAP SMEAR  05/23/2022  . HIV Screening  Completed    The following portions of the patient's history were reviewed and updated as appropriate: allergies, current medications, past family history, past medical history, past social history, past surgical history and problem list.  Review of Systems A comprehensive review of systems was negative.   Objective:    BP 127/65   Pulse 79   Ht 5' 3.78" (1.62 m)   Wt 181 lb (82.1 kg)   SpO2 99%   BMI 31.28 kg/m  General appearance: alert, cooperative and appears stated age Head: Normocephalic, without obvious abnormality, atraumatic Eyes: conj clear, EOMi, PEERLA Ears: normal TM's and external ear canals both  ears Nose: Nares normal. Septum midline. Mucosa normal. No drainage or sinus tenderness. Throat: lips, mucosa, and tongue normal; teeth and gums normal Neck: no adenopathy, no carotid bruit, no JVD, supple, symmetrical, trachea midline and thyroid not enlarged, symmetric, no tenderness/mass/nodules Back: symmetric, no curvature. ROM normal. No CVA tenderness. Lungs: clear to auscultation bilaterally Breasts: normal appearance, no masses or tenderness Heart: regular rate and rhythm, S1, S2 normal, no murmur, click, rub or gallop Abdomen: soft, non-tender; bowel sounds normal; no masses,  no organomegaly Extremities: extremities normal, atraumatic, no cyanosis or edema Pulses: 2+ and symmetric Skin: Skin color, texture, turgor normal. No rashes or lesions Lymph nodes: Cervical, supraclavicular, and axillary nodes normal. Neurologic:  Alert and oriented X 3, normal strength and tone. Normal symmetric reflexes. Normal coordination and gait    Assessment:    Healthy female exam.      Plan:     Keep up a regular exercise program and make sure you are eating a healthy diet Try to eat 4 servings of dairy a day, or if you are lactose intolerant take a calcium with vitamin D daily.  Your vaccines are up to date.   BMI 31 - discussed goal weight of 142 lbs to get to normal BMI.

## 2018-05-28 NOTE — Patient Instructions (Addendum)
Then set of fill cream for the area right around her eye. Goal weight would ultimately be 142 pounds to have a normal BMI of 25.

## 2018-05-29 ENCOUNTER — Encounter: Payer: Self-pay | Admitting: Family Medicine

## 2018-06-25 ENCOUNTER — Other Ambulatory Visit: Payer: Self-pay | Admitting: Family Medicine

## 2018-06-25 DIAGNOSIS — M1712 Unilateral primary osteoarthritis, left knee: Secondary | ICD-10-CM

## 2018-06-28 ENCOUNTER — Ambulatory Visit: Payer: Self-pay

## 2018-08-08 ENCOUNTER — Other Ambulatory Visit: Payer: Self-pay | Admitting: *Deleted

## 2018-08-08 DIAGNOSIS — M1712 Unilateral primary osteoarthritis, left knee: Secondary | ICD-10-CM

## 2018-08-08 MED ORDER — MELOXICAM 15 MG PO TABS: 15.0000 mg | ORAL_TABLET | Freq: Every day | ORAL | 1 refills | Status: DC | PRN

## 2018-10-02 DIAGNOSIS — M222X1 Patellofemoral disorders, right knee: Secondary | ICD-10-CM | POA: Insufficient documentation

## 2018-10-02 DIAGNOSIS — G8929 Other chronic pain: Secondary | ICD-10-CM | POA: Insufficient documentation

## 2018-10-02 DIAGNOSIS — M222X2 Patellofemoral disorders, left knee: Secondary | ICD-10-CM | POA: Insufficient documentation

## 2018-10-02 DIAGNOSIS — M170 Bilateral primary osteoarthritis of knee: Secondary | ICD-10-CM | POA: Insufficient documentation

## 2018-10-02 DIAGNOSIS — M25561 Pain in right knee: Secondary | ICD-10-CM | POA: Insufficient documentation

## 2018-10-20 ENCOUNTER — Other Ambulatory Visit: Payer: Self-pay | Admitting: Family Medicine

## 2019-01-19 ENCOUNTER — Other Ambulatory Visit: Payer: Self-pay | Admitting: Family Medicine

## 2019-01-19 DIAGNOSIS — M1712 Unilateral primary osteoarthritis, left knee: Secondary | ICD-10-CM

## 2019-04-08 ENCOUNTER — Other Ambulatory Visit: Payer: Self-pay | Admitting: Family Medicine

## 2019-04-08 DIAGNOSIS — Z1231 Encounter for screening mammogram for malignant neoplasm of breast: Secondary | ICD-10-CM

## 2019-04-09 ENCOUNTER — Ambulatory Visit (INDEPENDENT_AMBULATORY_CARE_PROVIDER_SITE_OTHER): Payer: Commercial Managed Care - PPO

## 2019-04-09 ENCOUNTER — Other Ambulatory Visit: Payer: Self-pay

## 2019-04-09 DIAGNOSIS — Z1231 Encounter for screening mammogram for malignant neoplasm of breast: Secondary | ICD-10-CM

## 2019-06-03 ENCOUNTER — Ambulatory Visit (INDEPENDENT_AMBULATORY_CARE_PROVIDER_SITE_OTHER): Payer: Commercial Managed Care - PPO | Admitting: Family Medicine

## 2019-06-03 ENCOUNTER — Other Ambulatory Visit: Payer: Self-pay

## 2019-06-03 ENCOUNTER — Encounter: Payer: Self-pay | Admitting: Family Medicine

## 2019-06-03 VITALS — BP 136/55 | HR 74 | Ht 64.0 in | Wt 197.0 lb

## 2019-06-03 DIAGNOSIS — Z0000 Encounter for general adult medical examination without abnormal findings: Secondary | ICD-10-CM | POA: Diagnosis not present

## 2019-06-03 DIAGNOSIS — E559 Vitamin D deficiency, unspecified: Secondary | ICD-10-CM | POA: Diagnosis not present

## 2019-06-03 DIAGNOSIS — Z85828 Personal history of other malignant neoplasm of skin: Secondary | ICD-10-CM

## 2019-06-03 DIAGNOSIS — Z23 Encounter for immunization: Secondary | ICD-10-CM

## 2019-06-03 NOTE — Progress Notes (Signed)
Subjective:     Andrea Tran is a 53 y.o. female and is here for a comprehensive physical exam. The patient reports no problems.  She is planning on scheduling her colonoscopy.  Tetanus given today.  Social History   Socioeconomic History  . Marital status: Single    Spouse name: Ed  . Number of children: 0   . Years of education: Not on file  . Highest education level: Not on file  Occupational History  . Occupation: Engineer, petroleum  Social Needs  . Financial resource strain: Not on file  . Food insecurity    Worry: Not on file    Inability: Not on file  . Transportation needs    Medical: Not on file    Non-medical: Not on file  Tobacco Use  . Smoking status: Never Smoker  . Smokeless tobacco: Never Used  Substance and Sexual Activity  . Alcohol use: Yes    Alcohol/week: 0.0 - 1.0 standard drinks  . Drug use: No  . 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  Lifestyle  . Physical activity    Days per week: Not on file    Minutes per session: Not on file  . Stress: Not on file  Relationships  . Social Herbalist on phone: Not on file    Gets together: Not on file    Attends religious service: Not on file    Active member of club or organization: Not on file    Attends meetings of clubs or organizations: Not on file    Relationship status: Not on file  . Intimate partner violence    Fear of current or ex partner: Not on file    Emotionally abused: Not on file    Physically abused: Not on file    Forced sexual activity: Not on file  Other Topics Concern  . Not on file  Social History Narrative   1 caffeine per day. Plays tennis 2-3 x per week. On step duaghter   Health Maintenance  Topic Date Due  . COLONOSCOPY  08/07/2019 (Originally 09/06/2016)  . INFLUENZA VACCINE  10/15/2019 (Originally 02/15/2019)  . MAMMOGRAM  04/08/2021  . PAP  SMEAR-Modifier  05/23/2022  . TETANUS/TDAP  06/02/2029  . HIV Screening  Completed    The following portions of the patient's history were reviewed and updated as appropriate: allergies, current medications, past family history, past medical history, past social history, past surgical history and problem list.  Review of Systems A comprehensive review of systems was negative.   Objective:    BP (!) 136/55   Pulse 74   Ht 5\' 4"  (1.626 m)   Wt 197 lb (89.4 kg)   SpO2 96%   BMI 33.81 kg/m  General appearance: alert, cooperative and appears stated age Head: Normocephalic, without obvious abnormality, atraumatic Eyes: conj clear, EOMI, PEERLA Ears: normal TM's and external ear canals both ears Nose: Nares normal. Septum midline. Mucosa normal. No drainage or sinus tenderness. Throat: lips, mucosa, and tongue normal; teeth and gums normal Neck: no adenopathy, no carotid bruit, no JVD, supple, symmetrical, trachea midline and thyroid not enlarged, symmetric, no tenderness/mass/nodules Back: symmetric, no curvature. ROM normal. No CVA tenderness. Lungs: clear to auscultation bilaterally Heart: regular rate and rhythm, S1, S2 normal, no murmur, click, rub or gallop Abdomen: soft, non-tender; bowel sounds normal; no masses,  no  organomegaly Extremities: extremities normal, atraumatic, no cyanosis or edema Pulses: 2+ and symmetric Skin: Skin color, texture, turgor normal. No rashes or lesions Lymph nodes: Cervical adenopathy: nl and Supraclavicular adenopathy: nl Neurologic: Alert and oriented X 3, normal strength and tone. Normal symmetric reflexes. Normal coordination and gait    Assessment:    Healthy female exam.      Plan:     See After Visit Summary for Counseling Recommendations   Keep up a regular exercise program and make sure you are eating a healthy diet Try to eat 4 servings of dairy a day, or if you are lactose intolerant take a calcium with vitamin D daily.  Your  vaccines are up to date.

## 2019-06-03 NOTE — Patient Instructions (Signed)
Health Maintenance, Female Adopting a healthy lifestyle and getting preventive care are important in promoting health and wellness. Ask your health care provider about:  The right schedule for you to have regular tests and exams.  Things you can do on your own to prevent diseases and keep yourself healthy. What should I know about diet, weight, and exercise? Eat a healthy diet   Eat a diet that includes plenty of vegetables, fruits, low-fat dairy products, and lean protein.  Do not eat a lot of foods that are high in solid fats, added sugars, or sodium. Maintain a healthy weight Body mass index (BMI) is used to identify weight problems. It estimates body fat based on height and weight. Your health care provider can help determine your BMI and help you achieve or maintain a healthy weight. Get regular exercise Get regular exercise. This is one of the most important things you can do for your health. Most adults should:  Exercise for at least 150 minutes each week. The exercise should increase your heart rate and make you sweat (moderate-intensity exercise).  Do strengthening exercises at least twice a week. This is in addition to the moderate-intensity exercise.  Spend less time sitting. Even light physical activity can be beneficial. Watch cholesterol and blood lipids Have your blood tested for lipids and cholesterol at 52 years of age, then have this test every 5 years. Have your cholesterol levels checked more often if:  Your lipid or cholesterol levels are high.  You are older than 52 years of age.  You are at high risk for heart disease. What should I know about cancer screening? Depending on your health history and family history, you may need to have cancer screening at various ages. This may include screening for:  Breast cancer.  Cervical cancer.  Colorectal cancer.  Skin cancer.  Lung cancer. What should I know about heart disease, diabetes, and high blood  pressure? Blood pressure and heart disease  High blood pressure causes heart disease and increases the risk of stroke. This is more likely to develop in people who have high blood pressure readings, are of African descent, or are overweight.  Have your blood pressure checked: ? Every 3-5 years if you are 18-39 years of age. ? Every year if you are 40 years old or older. Diabetes Have regular diabetes screenings. This checks your fasting blood sugar level. Have the screening done:  Once every three years after age 40 if you are at a normal weight and have a low risk for diabetes.  More often and at a younger age if you are overweight or have a high risk for diabetes. What should I know about preventing infection? Hepatitis B If you have a higher risk for hepatitis B, you should be screened for this virus. Talk with your health care provider to find out if you are at risk for hepatitis B infection. Hepatitis C Testing is recommended for:  Everyone born from 1945 through 1965.  Anyone with known risk factors for hepatitis C. Sexually transmitted infections (STIs)  Get screened for STIs, including gonorrhea and chlamydia, if: ? You are sexually active and are younger than 52 years of age. ? You are older than 52 years of age and your health care provider tells you that you are at risk for this type of infection. ? Your sexual activity has changed since you were last screened, and you are at increased risk for chlamydia or gonorrhea. Ask your health care provider if   you are at risk.  Ask your health care provider about whether you are at high risk for HIV. Your health care provider may recommend a prescription medicine to help prevent HIV infection. If you choose to take medicine to prevent HIV, you should first get tested for HIV. You should then be tested every 3 months for as long as you are taking the medicine. Pregnancy  If you are about to stop having your period (premenopausal) and  you may become pregnant, seek counseling before you get pregnant.  Take 400 to 800 micrograms (mcg) of folic acid every day if you become pregnant.  Ask for birth control (contraception) if you want to prevent pregnancy. Osteoporosis and menopause Osteoporosis is a disease in which the bones lose minerals and strength with aging. This can result in bone fractures. If you are 65 years old or older, or if you are at risk for osteoporosis and fractures, ask your health care provider if you should:  Be screened for bone loss.  Take a calcium or vitamin D supplement to lower your risk of fractures.  Be given hormone replacement therapy (HRT) to treat symptoms of menopause. Follow these instructions at home: Lifestyle  Do not use any products that contain nicotine or tobacco, such as cigarettes, e-cigarettes, and chewing tobacco. If you need help quitting, ask your health care provider.  Do not use street drugs.  Do not share needles.  Ask your health care provider for help if you need support or information about quitting drugs. Alcohol use  Do not drink alcohol if: ? Your health care provider tells you not to drink. ? You are pregnant, may be pregnant, or are planning to become pregnant.  If you drink alcohol: ? Limit how much you use to 0-1 drink a day. ? Limit intake if you are breastfeeding.  Be aware of how much alcohol is in your drink. In the [place] one drink equals one 12 oz bottle of beer (355 mL), one 5 oz glass of wine (148 mL), or one 1 oz glass of hard liquor (44 mL). General instructions  Schedule regular health, dental, and eye exams.  Stay current with your vaccines.  Tell your health care provider if: ? You often feel depressed. ? You have ever been abused or do not feel safe at home. Summary  Adopting a healthy lifestyle and getting preventive care are important in promoting health and wellness.  Follow your health care provider's instructions about healthy  diet, exercising, and getting tested or screened for diseases.  Follow your health care provider's instructions on monitoring your cholesterol and blood pressure. This information is not intended to replace advice given to you by your health care provider. Make sure you discuss any questions you have with your health care provider. Document Released: 01/16/2011 Document Revised: 06/26/2018 Document Reviewed: 06/26/2018 Elsevier Patient Education  2020 [employer]  

## 2019-06-04 DIAGNOSIS — Z85828 Personal history of other malignant neoplasm of skin: Secondary | ICD-10-CM | POA: Insufficient documentation

## 2019-06-04 LAB — CBC
HCT: 45.3 % — ABNORMAL HIGH (ref 35.0–45.0)
Hemoglobin: 15.5 g/dL (ref 11.7–15.5)
MCH: 31.4 pg (ref 27.0–33.0)
MCHC: 34.2 g/dL (ref 32.0–36.0)
MCV: 91.7 fL (ref 80.0–100.0)
MPV: 9.7 fL (ref 7.5–12.5)
Platelets: 310 10*3/uL (ref 140–400)
RBC: 4.94 10*6/uL (ref 3.80–5.10)
RDW: 13 % (ref 11.0–15.0)
WBC: 4.7 10*3/uL (ref 3.8–10.8)

## 2019-06-04 LAB — COMPLETE METABOLIC PANEL WITH GFR
AG Ratio: 1.7 (calc) (ref 1.0–2.5)
ALT: 30 U/L — ABNORMAL HIGH (ref 6–29)
AST: 27 U/L (ref 10–35)
Albumin: 4.3 g/dL (ref 3.6–5.1)
Alkaline phosphatase (APISO): 80 U/L (ref 37–153)
BUN: 13 mg/dL (ref 7–25)
CO2: 26 mmol/L (ref 20–32)
Calcium: 9.4 mg/dL (ref 8.6–10.4)
Chloride: 105 mmol/L (ref 98–110)
Creat: 0.79 mg/dL (ref 0.50–1.05)
GFR, Est African American: 100 mL/min/{1.73_m2} (ref >=60)
GFR, Est Non African American: 86 mL/min/{1.73_m2} (ref >=60)
Globulin: 2.5 g/dL (calc) (ref 1.9–3.7)
Glucose, Bld: 89 mg/dL (ref 65–99)
Potassium: 4.7 mmol/L (ref 3.5–5.3)
Sodium: 141 mmol/L (ref 135–146)
Total Bilirubin: 0.8 mg/dL (ref 0.2–1.2)
Total Protein: 6.8 g/dL (ref 6.1–8.1)

## 2019-06-04 LAB — LIPID PANEL W/REFLEX DIRECT LDL
Cholesterol: 183 mg/dL (ref <200)
HDL: 67 mg/dL (ref >=50)
LDL Cholesterol (Calc): 97 mg/dL (calc)
Non-HDL Cholesterol (Calc): 116 mg/dL (calc) (ref <130)
Total CHOL/HDL Ratio: 2.7 (calc) (ref <5.0)
Triglycerides: 98 mg/dL (ref <150)

## 2019-06-04 LAB — TSH: TSH: 1.73 mIU/L

## 2019-07-18 DIAGNOSIS — C801 Malignant (primary) neoplasm, unspecified: Secondary | ICD-10-CM

## 2019-07-18 HISTORY — DX: Malignant (primary) neoplasm, unspecified: C80.1

## 2019-10-23 ENCOUNTER — Other Ambulatory Visit: Payer: Self-pay | Admitting: Family Medicine

## 2019-11-28 ENCOUNTER — Telehealth: Payer: Self-pay | Admitting: Family Medicine

## 2019-11-28 NOTE — Telephone Encounter (Signed)
Call pt: needs colon ca screening. What options would she like to do?

## 2019-12-01 NOTE — Telephone Encounter (Signed)
Sending to assistant  

## 2019-12-09 NOTE — Telephone Encounter (Signed)
LVM asking that she rtn call to let us know how she would like to proceed with screening.

## 2019-12-10 NOTE — Telephone Encounter (Signed)
lvm for rtn call.

## 2019-12-17 ENCOUNTER — Encounter: Payer: Self-pay | Admitting: *Deleted

## 2019-12-17 NOTE — Telephone Encounter (Signed)
Letter sent to pt

## 2019-12-17 NOTE — Progress Notes (Signed)
Letter sent to pt

## 2020-01-21 ENCOUNTER — Other Ambulatory Visit: Payer: Self-pay | Admitting: Family Medicine

## 2020-01-21 DIAGNOSIS — M1712 Unilateral primary osteoarthritis, left knee: Secondary | ICD-10-CM

## 2020-03-07 ENCOUNTER — Encounter: Payer: Self-pay | Admitting: Family Medicine

## 2020-03-12 ENCOUNTER — Telehealth: Payer: Self-pay

## 2020-03-12 DIAGNOSIS — Z1211 Encounter for screening for malignant neoplasm of colon: Secondary | ICD-10-CM

## 2020-03-12 NOTE — Telephone Encounter (Signed)
Andrea Tran is ready to proceed with a colonoscopy. I have placed referral.

## 2020-03-12 NOTE — Telephone Encounter (Signed)
Awesome! Thank you!

## 2020-03-18 ENCOUNTER — Encounter: Payer: Self-pay | Admitting: Gastroenterology

## 2020-04-08 ENCOUNTER — Other Ambulatory Visit: Payer: Self-pay | Admitting: Family Medicine

## 2020-04-08 DIAGNOSIS — Z1231 Encounter for screening mammogram for malignant neoplasm of breast: Secondary | ICD-10-CM

## 2020-04-14 ENCOUNTER — Other Ambulatory Visit: Payer: Self-pay

## 2020-04-14 ENCOUNTER — Ambulatory Visit (INDEPENDENT_AMBULATORY_CARE_PROVIDER_SITE_OTHER): Payer: Commercial Managed Care - PPO

## 2020-04-14 DIAGNOSIS — Z1231 Encounter for screening mammogram for malignant neoplasm of breast: Secondary | ICD-10-CM | POA: Diagnosis not present

## 2020-04-19 ENCOUNTER — Other Ambulatory Visit: Payer: Self-pay | Admitting: Family Medicine

## 2020-04-19 DIAGNOSIS — R928 Other abnormal and inconclusive findings on diagnostic imaging of breast: Secondary | ICD-10-CM

## 2020-04-20 ENCOUNTER — Other Ambulatory Visit: Payer: Self-pay | Admitting: Family Medicine

## 2020-04-20 DIAGNOSIS — M1712 Unilateral primary osteoarthritis, left knee: Secondary | ICD-10-CM

## 2020-04-28 ENCOUNTER — Ambulatory Visit
Admission: RE | Admit: 2020-04-28 | Discharge: 2020-04-28 | Disposition: A | Discharge status: Home / Self Care | Payer: Commercial Managed Care - PPO | Source: Ambulatory Visit | Attending: Family Medicine | Admitting: Family Medicine

## 2020-04-28 ENCOUNTER — Ambulatory Visit: Payer: Commercial Managed Care - PPO

## 2020-04-28 ENCOUNTER — Other Ambulatory Visit: Payer: Self-pay

## 2020-04-28 DIAGNOSIS — R928 Other abnormal and inconclusive findings on diagnostic imaging of breast: Secondary | ICD-10-CM

## 2020-05-07 ENCOUNTER — Ambulatory Visit (AMBULATORY_SURGERY_CENTER): Payer: Self-pay

## 2020-05-07 ENCOUNTER — Other Ambulatory Visit: Payer: Self-pay

## 2020-05-07 VITALS — Ht 64.0 in | Wt 165.0 lb

## 2020-05-07 DIAGNOSIS — Z1211 Encounter for screening for malignant neoplasm of colon: Secondary | ICD-10-CM

## 2020-05-07 MED ORDER — SUTAB 1479-225-188 MG PO TABS: 1.0000 | ORAL_TABLET | ORAL | 0 refills | Status: DC

## 2020-05-07 NOTE — Progress Notes (Signed)
Pre visit completed via phone-patient verified name, DOB, and address, No egg or soy allergy known to patient  No issues with past sedation with any surgeries or procedures No intubation problems in the past  No FH of Malignant Hyperthermia No diet pills per patient No home 02 use per patient  No blood thinners per patient  Pt denies issues with constipation  No A fib or A flutter  EMMI video via [place] 19 guidelines implemented in [place] today with Pt and RN  Coupon given to pt in [facility] today , Code to Pharmacy  COVID vaccines completed on 10/2019 per pt;  Due to the COVID-19 pandemic we are asking patients to follow these guidelines. Please only bring one care partner. Please be aware that your care partner may wait in the car in the parking lot or if they feel like they will be too hot to wait in the car, they may wait in the lobby on the 4th floor. All care partners are required to wear a mask the entire time (we do not have any that we can provide them), they need to practice social distancing, and we will do a Covid check for all patient's and care partners when you arrive. Also we will check their temperature and your temperature. If the care partner waits in their car they need to stay in the parking lot the entire time and we will call them on their cell phone when the patient is ready for discharge so they can bring the car to the front of the building. Also all patient's will need to wear a mask into building.

## 2020-05-21 ENCOUNTER — Encounter: Payer: Commercial Managed Care - PPO | Admitting: Gastroenterology

## 2020-06-03 ENCOUNTER — Encounter: Payer: Commercial Managed Care - PPO | Admitting: Family Medicine

## 2020-06-04 ENCOUNTER — Other Ambulatory Visit: Payer: Self-pay

## 2020-06-04 ENCOUNTER — Encounter: Payer: Self-pay | Admitting: Gastroenterology

## 2020-06-04 ENCOUNTER — Ambulatory Visit (AMBULATORY_SURGERY_CENTER): Payer: Commercial Managed Care - PPO | Admitting: Gastroenterology

## 2020-06-04 VITALS — BP 137/69 | HR 58 | Temp 97.7°F | Resp 9 | Ht 64.0 in | Wt 165.0 lb

## 2020-06-04 DIAGNOSIS — Z1211 Encounter for screening for malignant neoplasm of colon: Secondary | ICD-10-CM

## 2020-06-04 DIAGNOSIS — D120 Benign neoplasm of cecum: Secondary | ICD-10-CM

## 2020-06-04 MED ORDER — SODIUM CHLORIDE 0.9 % IV SOLN: 500.0000 mL | Freq: Once | INTRAVENOUS | Status: DC

## 2020-06-04 NOTE — Progress Notes (Signed)
To PACU, VSS. Report to [email or web address] 

## 2020-06-04 NOTE — Progress Notes (Signed)
Called to room to assist during endoscopic procedure.  Patient ID and intended procedure confirmed with present staff. Received instructions for my participation in the procedure from the performing physician.  

## 2020-06-04 NOTE — Patient Instructions (Signed)

## 2020-06-04 NOTE — Op Note (Signed)
[facility] Patient Name: Andrea Tran Procedure Date: 06/04/2020 9:03 AM MRN: [id number] Endoscopist: Thornton Park MD, MD Age: 54 Referring MD:  Date of Birth: 06-07-1966 Gender: Female Account #: [id number] Procedure:                Colonoscopy Indications:              Screening for colorectal malignant neoplasm, This                            is the patient's first colonoscopy                           No known family history of colon cancer or polyps Medicines:                Monitored Anesthesia Care Procedure:                Pre-Anesthesia Assessment:                           - Prior to the procedure, a History and Physical                            was performed, and patient medications and                            allergies were reviewed. The patient's tolerance of                            previous anesthesia was also reviewed. The risks                            and benefits of the procedure and the sedation                            options and risks were discussed with the patient.                            All questions were answered, and informed consent                            was obtained. Prior Anticoagulants: The patient has                            taken no previous anticoagulant or antiplatelet                            agents. ASA Grade Assessment: II - A patient with                            mild systemic disease. After reviewing the risks                            and benefits, the patient was deemed in  satisfactory condition to undergo the procedure.                           After obtaining informed consent, the colonoscope                            was passed under direct vision. Throughout the                            procedure, the patient's blood pressure, pulse, and                            oxygen saturations were monitored continuously. The                            Colonoscope was  introduced through the anus and                            advanced to the the cecum, identified by                            appendiceal orifice and ileocecal valve. The                            colonoscopy was performed with difficulty due to a                            redundant colon. Successful completion of the                            procedure was aided by applying abdominal pressure.                            The patient tolerated the procedure well. The                            quality of the bowel preparation was good. Scope In: 9:11:24 AM Scope Out: 9:28:55 AM Scope Withdrawal Time: 0 hours 11 minutes 24 seconds  Total Procedure Duration: 0 hours 17 minutes 31 seconds  Findings:                 The perianal and digital rectal examinations were                            normal.                           A 2 mm polyp was found in the cecum. The polyp was                            sessile. The polyp was removed with a cold snare.                            Resection and retrieval were complete. Estimated  blood loss was minimal.                           A diffuse area of moderately melanotic mucosa was                            found in the entire colon.                           The exam was otherwise without abnormality on                            direct and retroflexion views. Complications:            No immediate complications. Estimated blood loss:                            Minimal. Estimated Blood Loss:     Estimated blood loss was minimal. Impression:               - One 2 mm polyp in the cecum, removed with a cold                            snare. Resected and retrieved.                           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                           - The examination was otherwise normal on direct                            and retroflexion views. Recommendation:           - Patient has a contact number  available for                            emergencies. The signs and symptoms of potential                            delayed complications were discussed with the                            patient. Return to normal activities tomorrow.                            Written discharge instructions were provided to the                            patient.                           - Resume previous diet.                           - Continue present medications.                           -  Await pathology results.                           - Repeat colonoscopy date to be determined after                            pending pathology results are reviewed for                            surveillance.                           - Emerging evidence supports eating a diet of                            fruits, vegetables, grains, calcium, and yogurt                            while reducing red meat and alcohol may reduce the                            risk of colon cancer.                           - Thank you for allowing me to be involved in your                            colon cancer prevention. Thornton Park MD, MD 06/04/2020 9:32:53 AM This report has been signed electronically.

## 2020-06-08 ENCOUNTER — Telehealth: Payer: Self-pay | Admitting: *Deleted

## 2020-06-08 ENCOUNTER — Telehealth: Payer: Self-pay

## 2020-06-08 ENCOUNTER — Encounter: Payer: Self-pay | Admitting: Family Medicine

## 2020-06-08 ENCOUNTER — Ambulatory Visit (INDEPENDENT_AMBULATORY_CARE_PROVIDER_SITE_OTHER): Payer: Commercial Managed Care - PPO | Admitting: Family Medicine

## 2020-06-08 VITALS — BP 111/57 | HR 66 | Ht 64.0 in | Wt 162.0 lb

## 2020-06-08 DIAGNOSIS — Z0000 Encounter for general adult medical examination without abnormal findings: Secondary | ICD-10-CM

## 2020-06-08 DIAGNOSIS — E559 Vitamin D deficiency, unspecified: Secondary | ICD-10-CM

## 2020-06-08 LAB — CBC
HCT: 44 % (ref 35.0–45.0)
Hemoglobin: 15.1 g/dL (ref 11.7–15.5)
MCH: 31.5 pg (ref 27.0–33.0)
MCHC: 34.3 g/dL (ref 32.0–36.0)
MCV: 91.7 fL (ref 80.0–100.0)
MPV: 9.9 fL (ref 7.5–12.5)
Platelets: 302 10*3/uL (ref 140–400)
RBC: 4.8 10*6/uL (ref 3.80–5.10)
RDW: 12.3 % (ref 11.0–15.0)
WBC: 4.4 10*3/uL (ref 3.8–10.8)

## 2020-06-08 LAB — COMPLETE METABOLIC PANEL WITH GFR
AG Ratio: 1.7 (calc) (ref 1.0–2.5)
ALT: 18 U/L (ref 6–29)
AST: 20 U/L (ref 10–35)
Albumin: 4.3 g/dL (ref 3.6–5.1)
Alkaline phosphatase (APISO): 94 U/L (ref 37–153)
BUN: 18 mg/dL (ref 7–25)
CO2: 31 mmol/L (ref 20–32)
Calcium: 9.8 mg/dL (ref 8.6–10.4)
Chloride: 103 mmol/L (ref 98–110)
Creat: 0.78 mg/dL (ref 0.50–1.05)
GFR, Est African American: 101 mL/min/{1.73_m2} (ref >=60)
GFR, Est Non African American: 87 mL/min/{1.73_m2} (ref >=60)
Globulin: 2.5 g/dL (calc) (ref 1.9–3.7)
Glucose, Bld: 84 mg/dL (ref 65–99)
Potassium: 4.5 mmol/L (ref 3.5–5.3)
Sodium: 140 mmol/L (ref 135–146)
Total Bilirubin: 0.7 mg/dL (ref 0.2–1.2)
Total Protein: 6.8 g/dL (ref 6.1–8.1)

## 2020-06-08 LAB — LIPID PANEL W/REFLEX DIRECT LDL
Cholesterol: 182 mg/dL (ref <200)
HDL: 62 mg/dL (ref >=50)
LDL Cholesterol (Calc): 105 mg/dL (calc) — ABNORMAL HIGH
Non-HDL Cholesterol (Calc): 120 mg/dL (calc) (ref <130)
Total CHOL/HDL Ratio: 2.9 (calc) (ref <5.0)
Triglycerides: 66 mg/dL (ref <150)

## 2020-06-08 LAB — TSH: TSH: 1.57 mIU/L

## 2020-06-08 LAB — VITAMIN D 25 HYDROXY (VIT D DEFICIENCY, FRACTURES): Vit D, 25-Hydroxy: 44 ng/mL (ref 30–100)

## 2020-06-08 NOTE — Telephone Encounter (Signed)
First post procedure follow up call, no answer 

## 2020-06-08 NOTE — Progress Notes (Signed)
Subjective:     Andrea Tran is a 54 y.o. female and is here for a comprehensive physical exam. The patient reports no problems.  She exercises some but not as much as she feels like she should.  He is doing well overall feels like her mood is in a good place.  Did bring in a form to be completed today.  Social History   Socioeconomic History  . Marital status: Single    Spouse name: Ed  . Number of children: 0   . Years of education: Not on file  . Highest education level: Not on file  Occupational History  . Occupation: Sales Assot  Tobacco Use  . Smoking status: Never Smoker  . Smokeless tobacco: Never Used  Vaping Use  . Vaping Use: Never used  Substance and Sexual Activity  . Alcohol use: Yes    Alcohol/week: 0.0 - 1.0 standard drinks  . Drug use: No  . 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  Other Topics Concern  . Not on file  Social History Narrative   1 caffeine per day. Plays tennis 2-3 x per week. On step duaghter   Social Determinants of Health   Financial Resource Strain:   . Difficulty of Paying Living Expenses: Not on file  Food Insecurity:   . Worried About Charity fundraiser in the Last Year: Not on file  . Ran Out of Food in the Last Year: Not on file  Transportation Needs:   . Lack of Transportation (Medical): Not on file  . Lack of Transportation (Non-Medical): Not on file  Physical Activity:   . Days of Exercise per Week: Not on file  . Minutes of Exercise per Session: Not on file  Stress:   . Feeling of Stress : Not on file  Social Connections:   . Frequency of Communication with Friends and Family: Not on file  . Frequency of Social Gatherings with Friends and Family: Not on file  . Attends Religious Services: Not on file  . Active Member of Clubs or Organizations: Not on file  . Attends Archivist Meetings: Not on  file  . Marital Status: Not on file  Intimate Partner Violence:   . Fear of Current or Ex-Partner: Not on file  . Emotionally Abused: Not on file  . Physically Abused: Not on file  . Sexually Abused: Not on file   Health Maintenance  Topic Date Due  . INFLUENZA VACCINE  10/14/2020 (Originally 02/15/2020)  . MAMMOGRAM  04/14/2022  . PAP SMEAR-Modifier  05/23/2022  . TETANUS/TDAP  06/02/2029  . COLONOSCOPY  06/04/2030  . COVID-19 Vaccine  Completed  . Hepatitis C Screening  Completed  . HIV Screening  Completed    The following portions of the patient's history were reviewed and updated as appropriate: allergies, current medications, past family history, past medical history, past social history, past surgical history and problem list.  Review of Systems A comprehensive review of systems was negative.   Objective:    BP (!) 111/57   Pulse 66   Ht 5\' 4"  (1.626 m)   Wt 162 lb (73.5 kg)   LMP 07/14/2017 (Exact Date)   SpO2 100%   BMI 27.81 kg/m  General appearance: alert, cooperative and appears stated age Head: Normocephalic, without obvious abnormality, atraumatic Eyes: conj clear, EOMI, PEERLA Ears: normal TM's and external  ear canals both ears Nose: Nares normal. Septum midline. Mucosa normal. No drainage or sinus tenderness. Throat: lips, mucosa, and tongue normal; teeth and gums normal Neck: no adenopathy, no carotid bruit, no JVD, supple, symmetrical, trachea midline and thyroid not enlarged, symmetric, no tenderness/mass/nodules Back: symmetric, no curvature. ROM normal. No CVA tenderness. Lungs: clear to auscultation bilaterally Breasts: Inspection negative Heart: regular rate and rhythm, S1, S2 normal, no murmur, click, rub or gallop Abdomen: soft, non-tender; bowel sounds normal; no masses,  no organomegaly Extremities: extremities normal, atraumatic, no cyanosis or edema Pulses: 2+ and symmetric Skin: Skin color, texture, turgor normal. No rashes or  lesions Lymph nodes: Cervical, supraclavicular, and axillary nodes normal. Neurologic: Alert and oriented X 3, normal strength and tone. Normal symmetric reflexes. Normal coordination and gait    Assessment:    Healthy female exam.      Plan:     See After Visit Summary for Counseling Recommendations   Keep up a regular exercise program and make sure you are eating a healthy diet Try to eat 4 servings of dairy a day, or if you are lactose intolerant take a calcium with vitamin D daily.  Your vaccines are up to date.  Labs ordered.

## 2020-06-08 NOTE — Telephone Encounter (Signed)
°  Follow up Call-  Call back number 06/04/2020  Post procedure Call Back phone  # [phone]  Permission to leave phone message Yes  Some recent data might be hidden    LMOM to call back with any questions or concerns.  Also, call back if patient has developed fever, respiratory issues or been dx with COVID or had any family members or close contacts diagnosed since her procedure.

## 2020-06-14 ENCOUNTER — Encounter: Payer: Self-pay | Admitting: Gastroenterology

## 2020-07-19 ENCOUNTER — Other Ambulatory Visit: Payer: Self-pay | Admitting: Family Medicine

## 2020-07-19 DIAGNOSIS — M1712 Unilateral primary osteoarthritis, left knee: Secondary | ICD-10-CM

## 2020-10-18 ENCOUNTER — Other Ambulatory Visit: Payer: Self-pay | Admitting: Family Medicine

## 2021-05-04 ENCOUNTER — Encounter: Payer: Self-pay | Admitting: Family Medicine

## 2021-05-04 ENCOUNTER — Telehealth: Payer: Commercial Managed Care - PPO | Admitting: Nurse Practitioner

## 2021-05-04 DIAGNOSIS — U071 COVID-19: Secondary | ICD-10-CM | POA: Diagnosis not present

## 2021-05-04 MED ORDER — MOLNUPIRAVIR EUA 200MG CAPSULE: 4.0000 | ORAL_CAPSULE | Freq: Two times a day (BID) | ORAL | 0 refills | Status: AC

## 2021-05-04 NOTE — Progress Notes (Signed)
Virtual Visit Consent   Andrea Tran, you are scheduled for a virtual visit with Mary-Margaret Hassell Done, [place], a [facility] provider, today.     Just as with appointments in the office, your consent must be obtained to participate.  Your consent will be active for this visit and any virtual visit you may have with one of our providers in the next 365 days.     If you have a MyChart account, a copy of this consent can be sent to you electronically.  All virtual visits are billed to your insurance company just like a traditional visit in the office.    As this is a virtual visit, video technology does not allow for your provider to perform a traditional examination.  This may limit your provider's ability to fully assess your condition.  If your provider identifies any concerns that need to be evaluated in person or the need to arrange testing (such as labs, EKG, etc.), we will make arrangements to do so.     Although advances in technology are sophisticated, we cannot ensure that it will always work on either your end or our end.  If the connection with a video visit is poor, the visit may have to be switched to a telephone visit.  With either a video or telephone visit, we are not always able to ensure that we have a secure connection.     I need to obtain your verbal consent now.   Are you willing to proceed with your visit today? YES   Andrea Tran has provided verbal consent on 05/04/2021 for a virtual visit (video or telephone).   Mary-Margaret Hassell Done, FNP   Date: 05/04/2021 1:37 PM   Virtual Visit via Video Note   I, Mary-Margaret Hassell Done, connected with Andrea Tran ([id number], [birth year]) on 05/04/21 at  1:30 PM EDT by a video-enabled telemedicine application and verified that I am speaking with the correct person using two identifiers.  Location: Patient: Virtual Visit Location Patient: Home Provider: Virtual Visit Location Provider: Mobile   I discussed the  limitations of evaluation and management by telemedicine and the availability of in person appointments. The patient expressed understanding and agreed to proceed.    History of Present Illness: Andrea Tran is a 55 y.o. who identifies as a female who was assigned female at birth, and is being seen today for covid positive.  HPI: Patient states that she developed a cold runny nos, with mainly stuffiness. This morning turned into deeper couh with duarrhea. Fvere around 100. Tested positive forcovid this morning.    Review of Systems  Constitutional:  Positive for chills, fever and malaise/fatigue.  HENT:  Positive for congestion and sore throat.   Respiratory:  Positive for cough and sputum production. Negative for shortness of breath.   Gastrointestinal:  Positive for diarrhea.  Musculoskeletal:  Negative for myalgias.  Neurological:  Headaches: better today.   Problems:  Patient Active Problem List   Diagnosis Date Noted   History of basal cell cancer 06/04/2019   Primary osteoarthritis of both knees 10/02/2018   Patellofemoral pain syndrome of both knees 10/02/2018   Chronic pain of both knees 10/02/2018   Perimenopausal symptoms 03/26/2018   Primary osteoarthritis of left knee 05/23/2016   Genital herpes 02/10/2013   HEMORRHOIDS 06/03/2010   NONSPEC ELEVATION OF LEVELS OF TRANSAMINASE/LDH 01/07/2009    Allergies:  Allergies  Allergen Reactions   Banana Swelling    Blisters in mouth   Medications:  Current Outpatient Medications:    fluticasone (FLONASE) 50 MCG/ACT nasal spray, Place 2 sprays into both nostrils daily., Disp: 16 g, Rfl: 2   meloxicam (MOBIC) 15 MG tablet, Take 1 tablet (15 mg total) by mouth daily., Disp: 90 tablet, Rfl: 3   valACYclovir (VALTREX) 500 MG tablet, TAKE 1 TABLET DAILY, Disp: 90 tablet, Rfl: 3  Observations/Objective: Patient is well-developed, well-nourished in no acute distress.  Resting comfortably  at home.  Head is normocephalic,  atraumatic.  No labored breathing.  Speech is clear and coherent with logical content.  Patient is alert and oriented at baseline.  No cough during visit.  Assessment and Plan:  Andrea Tran in today with chief complaint of Covid Positive   1. Lab test positive for detection of COVID-19 virus 1. Take meds as prescribed 2. Use a cool mist humidifier especially during the winter months and when heat has been humid. 3. Use saline nose sprays frequently 4. Saline irrigations of the nose can be very helpful if done frequently.  * 4X daily for 1 week*  * Use of a nettie pot can be helpful with this. Follow directions with this* 5. Drink plenty of fluids 6. Keep thermostat turn down low 7.For any cough or congestion  Use plain Mucinex- regular strength or max strength is fine   * Children- consult with Pharmacist for dosing 8. For fever or aces or pains- take tylenol or ibuprofen appropriate for age and weight.  * for fevers greater than 101 orally you may alternate ibuprofen and tylenol every  3 hours.   Meds ordered this encounter  Medications   molnupiravir EUA (LAGEVRIO) 200 mg CAPS capsule    Sig: Take 4 capsules (800 mg total) by mouth 2 (two) times daily for 5 days.    Dispense:  40 capsule    Refill:  0    Order Specific Question:   Supervising Provider    Answer:   Noemi Chapel [[id number]]   Patient Instructions  You are being prescribed MOLNUPIRAVIR for COVID-19 infection.   Please call the pharmacy or go through the drive through vs going inside if you are picking up the mediation yourself to prevent further spread. If prescribed to a [facility] affiliated pharmacy, a pharmacist will bring the medication out to your car.   ADMINISTRATION INSTRUCTIONS: Take with or without food. Swallow the tablets whole. Don't chew, crush, or break the medications because it might not work as well  For each dose of the medication, you should be taking FOUR tablets at one time, TWICE a  day   Finish your full five-day course of Molnupiravir even if you feel better before you're done. Stopping this medication too early can make it less effective to prevent severe illness related to [place].    Molnupiravir is prescribed for YOU ONLY. Don't share it with others, even if they have similar symptoms as you. This medication might not be right for everyone.   Make sure to take steps to protect yourself and others while you're taking this medication in order to get well soon and to prevent others from getting sick with COVID-19.   **If you are of childbearing potential (any gender) - it is advised to not get pregnant while taking this medication and recommended that condoms are used for female partners the next 3 months after taking the medication out of extreme caution    COMMON SIDE EFFECTS: Diarrhea Nausea  Dizziness    If your COVID-19 symptoms get  worse, get medical help right away. Call 911 if you experience symptoms such as worsening cough, trouble breathing, chest pain that doesn't go away, confusion, a hard time staying awake, and pale or blue-colored skin. This medication won't prevent all COVID-19 cases from getting worse.      Follow Up Instructions: I discussed the assessment and treatment plan with the patient. The patient was provided an opportunity to ask questions and all were answered. The patient agreed with the plan and demonstrated an understanding of the instructions.  A copy of instructions were sent to the patient via MyChart.  The patient was advised to call back or seek an in-person evaluation if the symptoms worsen or if the condition fails to improve as anticipated.  Time:  I spent 11 minutes with the patient via telehealth technology discussing the above problems/concerns.    Mary-Margaret Hassell Done, FNP

## 2021-05-04 NOTE — Patient Instructions (Signed)
You are being prescribed MOLNUPIRAVIR for COVID-19 infection.  ° ° °Please call the pharmacy or go through the drive through vs going inside if you are picking up the mediation yourself to prevent further spread. If prescribed to a [facility] affiliated pharmacy, a pharmacist will bring the medication out to your car. ° ° °ADMINISTRATION INSTRUCTIONS: °Take with or without food. Swallow the tablets whole. Don't chew, crush, or break the medications because it might not work as well ° °For each dose of the medication, you should be taking FOUR tablets at one time, TWICE a day  ° °Finish your full five-day course of Molnupiravir even if you feel better before you're done. Stopping this medication too early can make it less effective to prevent severe illness related to COVID19.   ° °Molnupiravir is prescribed for YOU ONLY. Don't share it with others, even if they have similar symptoms as you. This medication might not be right for everyone.  ° °Make sure to take steps to protect yourself and others while you're taking this medication in order to get well soon and to prevent others from getting sick with COVID-19. ° ° °**If you are of childbearing potential (any gender) - it is advised to not get pregnant while taking this medication and recommended that condoms are used for female partners the next 3 months after taking the medication out of extreme caution  ° ° °COMMON SIDE EFFECTS: °Diarrhea °Nausea  °Dizziness ° ° ° °If your COVID-19 symptoms get worse, get medical help right away. Call 911 if you experience symptoms such as worsening cough, trouble breathing, chest pain that doesn't go away, confusion, a hard time staying awake, and pale or blue-colored skin. °This medication won't prevent all COVID-19 cases from getting worse.  ° ° °

## 2021-05-05 ENCOUNTER — Telehealth: Payer: Commercial Managed Care - PPO | Admitting: Family Medicine

## 2021-06-13 ENCOUNTER — Ambulatory Visit (INDEPENDENT_AMBULATORY_CARE_PROVIDER_SITE_OTHER): Payer: Commercial Managed Care - PPO | Admitting: Family Medicine

## 2021-06-13 ENCOUNTER — Encounter: Payer: Self-pay | Admitting: Family Medicine

## 2021-06-13 ENCOUNTER — Other Ambulatory Visit: Payer: Self-pay

## 2021-06-13 ENCOUNTER — Other Ambulatory Visit (HOSPITAL_COMMUNITY)
Admission: RE | Admit: 2021-06-13 | Discharge: 2021-06-13 | Disposition: A | Discharge status: Home / Self Care | Payer: Commercial Managed Care - PPO | Source: Ambulatory Visit | Attending: Family Medicine | Admitting: Family Medicine

## 2021-06-13 VITALS — BP 130/71 | HR 70 | Ht 64.0 in | Wt 167.0 lb

## 2021-06-13 DIAGNOSIS — Z0000 Encounter for general adult medical examination without abnormal findings: Secondary | ICD-10-CM | POA: Insufficient documentation

## 2021-06-13 DIAGNOSIS — Z124 Encounter for screening for malignant neoplasm of cervix: Secondary | ICD-10-CM | POA: Diagnosis present

## 2021-06-13 DIAGNOSIS — Z77011 Contact with and (suspected) exposure to lead: Secondary | ICD-10-CM

## 2021-06-13 DIAGNOSIS — N841 Polyp of cervix uteri: Secondary | ICD-10-CM | POA: Diagnosis not present

## 2021-06-13 NOTE — Progress Notes (Signed)
Subjective:     Andrea Tran is a 55 y.o. female and is here for a comprehensive physical exam. The patient reports problems - lump above inner right ankle.  . Mammo schedule for next week.    She has noticed a lump on her right inner ankle above the medial malleolus.  She says is not painful or bothersome but wanted me to check it out.  He also thinks she might be getting another Bartholin cyst on the left labia.  She feels like it cysts in a similar location to 1 that she has had previously.  But its not been bothersome or painful this time its been there for several weeks already.  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  She would like to have a lead level drawn.  Social History   Socioeconomic History   Marital status: Single    Spouse name: Ed   Number of children: 0    Years of education: Not on file   Highest education level: Not on file  Occupational History   Occupation: Sales Assot  Tobacco Use   Smoking status: Never   Smokeless tobacco: Never  Vaping Use   Vaping Use: Never used  Substance and Sexual Activity   Alcohol use: Yes    Alcohol/week: 0.0 - 1.0 standard drinks   Drug use: No   Sexual activity: Yes    Partners: Male    Comment: sales asst [name], BS biology, married, one Psychiatrist, first husband deceased, reg exercise.  Other Topics Concern   Not on file  Social History Narrative   1 caffeine per day. Plays tennis 2-3 x per week. On step duaghter   Social Determinants of Health   Financial Resource Strain: Not on file  Food Insecurity: Not on file  Transportation Needs: Not on file  Physical Activity: Not on file  Stress: Not on file  Social Connections: Not on file  Intimate Partner Violence: Not on file   Health Maintenance  Topic Date Due   Pneumococcal Vaccine 96-24 Years old (1 - PCV) Never done   COVID-19 Vaccine (3 - Pfizer risk series) 11/28/2019   INFLUENZA VACCINE   02/14/2021   MAMMOGRAM  04/14/2022   PAP SMEAR-Modifier  05/23/2022   COLONOSCOPY (Pts 45-70yrs Insurance coverage will need to be confirmed)  06/05/2027   TETANUS/TDAP  06/02/2029   Hepatitis C Screening  Completed   HIV Screening  Completed   Zoster Vaccines- Shingrix  Completed   HPV VACCINES  Aged Out    The following portions of the patient's history were reviewed and updated as appropriate: allergies, current medications, past family history, past medical history, past social history, past surgical history, and problem list.  Review of Systems Pertinent items are noted in HPI.   Objective:    BP 130/71   Pulse 70   Ht 5\' 4"  (1.626 m)   Wt 167 lb (75.8 kg)   LMP 07/14/2017 (Exact Date)   SpO2 100%   BMI 28.67 kg/m  General appearance: alert, cooperative, and appears stated age Head: Normocephalic, without obvious abnormality, atraumatic Eyes:  conj clear, EOMi,  Ears: normal TM's and external ear canals both ears Nose: Nares normal. Septum midline. Mucosa normal. No drainage or sinus tenderness. Throat: lips, mucosa, and tongue normal; teeth and gums normal Neck: no adenopathy, no carotid bruit, no JVD, supple, symmetrical, trachea midline, and thyroid not enlarged, symmetric, no tenderness/mass/nodules  Back: symmetric, no curvature. ROM normal. No CVA tenderness. Lungs: clear to auscultation bilaterally Breasts: normal appearance, no masses or tenderness Heart: regular rate and rhythm, S1, S2 normal, no murmur, click, rub or gallop Abdomen: soft, non-tender; bowel sounds normal; no masses,  no organomegaly Pelvic: external genitalia normal, no adnexal masses or tenderness, no cervical motion tenderness, rectovaginal septum normal, uterus normal size, shape, and consistency, vagina normal without discharge, and cervical polyp Extremities: extremities normal, atraumatic, no cyanosis or edema Pulses: 2+ and symmetric Skin: Skin color, texture, turgor normal. No rashes or  lesions Lymph nodes: Cervical, supraclavicular, and axillary nodes normal. Neurologic: Grossly normal    Assessment:    Healthy female exam.      Plan:     See After Visit Summary for Counseling Recommendations  Keep up a regular exercise program and make sure you are eating a healthy diet Try to eat 4 servings of dairy a day, or if you are lactose intolerant take a calcium with vitamin D daily.  Your vaccines are up to date.    Mass on right inner ankle-possible lipoma.  It is nontender there is no rash no pitting.  We discussed continuing to monitor it and if at any point it changes were happy to do some imaging.  Cyst on left lower labia near the rectal area actually.  It feels fairly small at this point I actually wonder if it could be more of a sebaceous cyst versus a Bartholin cyst.  But if it gets more tender or larger then please let me know and we will refer her to GYN for further treatment.  We will screen lead levels on her blood work today since she has had positive exposure.  Cervical polyp seen on pelvic exam today.  Will refer to GYN for more definitive treatment and biopsy of the polyp.  Mammogram has been scheduled.

## 2021-06-13 NOTE — Progress Notes (Signed)
Pt is fasting today, her mammogram is scheduled for Wednesday.   She reports that she has a bartholin's cyst that came up about 2 weeks ago.

## 2021-06-14 ENCOUNTER — Other Ambulatory Visit: Payer: Self-pay | Admitting: Family Medicine

## 2021-06-14 DIAGNOSIS — Z1231 Encounter for screening mammogram for malignant neoplasm of breast: Secondary | ICD-10-CM

## 2021-06-14 LAB — CYTOLOGY - PAP
Comment: NEGATIVE
Diagnosis: NEGATIVE
High risk HPV: NEGATIVE

## 2021-06-14 NOTE — Progress Notes (Signed)
Hi Andrea Tran, thyroid, and blood count look great.  Metabolic panel is normal.  LDL cholesterol is just slightly elevated above normal.  Continue to work on healthy diet and regular exercise.

## 2021-06-14 NOTE — Progress Notes (Signed)
Your Pap smear is normal. Repeat in 5 years.

## 2021-06-15 ENCOUNTER — Ambulatory Visit (INDEPENDENT_AMBULATORY_CARE_PROVIDER_SITE_OTHER): Payer: Commercial Managed Care - PPO

## 2021-06-15 ENCOUNTER — Other Ambulatory Visit: Payer: Self-pay

## 2021-06-15 DIAGNOSIS — Z1231 Encounter for screening mammogram for malignant neoplasm of breast: Secondary | ICD-10-CM | POA: Diagnosis not present

## 2021-06-15 LAB — CBC
HCT: 42.5 % (ref 35.0–45.0)
Hemoglobin: 14.1 g/dL (ref 11.7–15.5)
MCH: 31.2 pg (ref 27.0–33.0)
MCHC: 33.2 g/dL (ref 32.0–36.0)
MCV: 94 fL (ref 80.0–100.0)
MPV: 9.6 fL (ref 7.5–12.5)
Platelets: 269 10*3/uL (ref 140–400)
RBC: 4.52 10*6/uL (ref 3.80–5.10)
RDW: 13 % (ref 11.0–15.0)
WBC: 4.7 10*3/uL (ref 3.8–10.8)

## 2021-06-15 LAB — COMPLETE METABOLIC PANEL WITH GFR
AG Ratio: 1.8 (calc) (ref 1.0–2.5)
ALT: 18 U/L (ref 6–29)
AST: 22 U/L (ref 10–35)
Albumin: 4.4 g/dL (ref 3.6–5.1)
Alkaline phosphatase (APISO): 92 U/L (ref 37–153)
BUN: 17 mg/dL (ref 7–25)
CO2: 30 mmol/L (ref 20–32)
Calcium: 9.6 mg/dL (ref 8.6–10.4)
Chloride: 105 mmol/L (ref 98–110)
Creat: 0.76 mg/dL (ref 0.50–1.03)
Globulin: 2.4 g/dL (calc) (ref 1.9–3.7)
Glucose, Bld: 85 mg/dL (ref 65–99)
Potassium: 4.3 mmol/L (ref 3.5–5.3)
Sodium: 142 mmol/L (ref 135–146)
Total Bilirubin: 0.6 mg/dL (ref 0.2–1.2)
Total Protein: 6.8 g/dL (ref 6.1–8.1)
eGFR: 93 mL/min/{1.73_m2} (ref >=60)

## 2021-06-15 LAB — TSH: TSH: 2.1 mIU/L

## 2021-06-15 LAB — LIPID PANEL
Cholesterol: 199 mg/dL (ref <200)
HDL: 76 mg/dL (ref >=50)
LDL Cholesterol (Calc): 108 mg/dL (calc) — ABNORMAL HIGH
Non-HDL Cholesterol (Calc): 123 mg/dL (calc) (ref <130)
Total CHOL/HDL Ratio: 2.6 (calc) (ref <5.0)
Triglycerides: 65 mg/dL (ref <150)

## 2021-06-15 LAB — LEAD, BLOOD (ADULT >= 16 YRS): Lead: 10.6 ug/dL — ABNORMAL HIGH (ref <3.5)

## 2021-06-15 NOTE — Progress Notes (Signed)
Please call patient. Normal mammogram.  Repeat in 1 year.  

## 2021-06-21 NOTE — Progress Notes (Signed)
Hi Andrea Tran, sorry for the delay in getting back to you.  Lead levels are mildly elevated around 10.6.  So I am going to a little research to see what category this puts you into as far as risk.  Going forward I would definitely try to minimize any exposure to lead that you can.

## 2021-06-22 NOTE — Progress Notes (Signed)
Hi Andrea Tran, thank you for being patient and letting me get back to you.  I had to check on current recommendations for elevated lead levels and adults are some pretty good guidelines for children but it is a little less well-defined in adults.  They are recommending that if your lead level is between 10 and 20, which yours is, that you try to minimize future exposure is much as possible.  So trying to wear gloves and then making sure that those gloves are either disposable or cleaned.  Also making sure that you are not coming in contact with other things such as paints etc. that might also have led in the.  They did recommend that we recheck your level again in 3 to 4 months to see if it is trending in 1 direction or the other.  Hopefully down.  High lead levels over time can cause some kidney problems so again we do want to try to make sure that those levels are going down over time.  You do want a make sure that you are also not being exposed to any type of lead dust that could come from handling the bullets.  Any type of contaminated clothing or equipment should be stored somewhere outside of the home if possible.  Any contaminant clothing needs to be laundered separately from other clothing.  Contaminated shoes need to stay outside of the home.  Hopefully this is helpful information.

## 2021-07-13 NOTE — Progress Notes (Signed)
GYNECOLOGY OFFICE VISIT NOTE  History:   Andrea Tran is a 55 y.o. G0P0000 here today for potential removal of cervical polyp. Had exam with PCP and noted to have cervical polyp and referred her for removal. She denies any vaginal bleeding and was otherwise asymptomatic with it except one time she has some postcoital bleeding but this was months ago. It didn't happen again so she didn't think anything of it.   She denies any abnormal vaginal discharge, pain or other concerns.     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   Health Maintenance:   Diagnosis  Date Value Ref Range Status  06/13/2021   Final   - Negative for intraepithelial lesion or malignancy (NILM)    Normal mammogram on 06/15/21.   Review of Systems:  Pertinent items noted in HPI and remainder of comprehensive ROS otherwise negative.  Physical Exam:  BP 124/80    Pulse 78    Ht 5\' 4"  (1.626 m)    Wt 171 lb (77.6 kg)    LMP 07/14/2017 (Exact Date)    BMI 29.35 kg/m  CONSTITUTIONAL: Well-developed, well-nourished female in no acute distress.  HEENT:  Normocephalic, atraumatic. External right and left ear normal. No scleral icterus.  NECK: Normal range of motion, supple, no masses noted on observation SKIN: No rash noted. Not diaphoretic. No  erythema. No pallor. MUSCULOSKELETAL: Normal range of motion. No edema noted. NEUROLOGIC: Alert and oriented to person, place, and time. Normal muscle tone coordination. No cranial nerve deficit noted. PSYCHIATRIC: Normal mood and affect. Normal behavior. Normal judgment and thought content.  PELVIC: Normal appearing external genitalia; normal urethral meatus; normal appearing vaginal mucosa and cervix - small 3 mm polyp noted on the cervix at the os.  No abnormal discharge noted.  Performed in the presence of a chaperone  Labs and Imaging No results found for this or any previous visit (from the past 168 hour(s)). MM 3D SCREEN BREAST BILATERAL  Result Date: 06/15/2021 CLINICAL DATA:  Screening. EXAM: DIGITAL SCREENING BILATERAL MAMMOGRAM WITH TOMOSYNTHESIS AND CAD COMPARISON:  Previous exam(s). ACR Breast Density Category c: The breast tissue is heterogeneously dense, which may obscure small masses. FINDINGS: There are no findings suspicious for malignancy. IMPRESSION: No mammographic evidence of malignancy. A result letter of this screening mammogram will be mailed directly to the patient. BI-RADS CATEGORY  1: Negative. Electronically Signed   By: Marin Olp M.D.   On: 06/15/2021 10:10     Procedure: Cervical polyp removal The indications were reviewed.   Risks of the removal including pain, bleeding, infection, and need for additional procedures  were discussed. The patient stated understanding and  agreed to undergo procedure today. Consent was signed,  time out performed.   The speculum was inserted and the polyp visualized. It was grasped with a ring forcep and removed with twisting.  No bleeding was noted.  The patient tolerated the procedure well.   Post-procedure instructions (pelvic rest for one week) were given to the patient. The patient is to call with heavy bleeding, fever greater than 100.4, foul smelling vaginal discharge or other concerns.   Assessment and Plan:    1.  Cervical polyp Pt preferred removal See procedure note above for details Polyp sent for pathology F/U prn but will communicate likely benign pathology to the patient.   Routine preventative health maintenance measures emphasized. Please refer to After Visit Summary for other counseling recommendations.   Return if symptoms worsen or fail to improve.  Radene Gunning, MD, [place] [facility], [place]

## 2021-07-14 ENCOUNTER — Other Ambulatory Visit (HOSPITAL_COMMUNITY)
Admission: RE | Admit: 2021-07-14 | Discharge: 2021-07-14 | Disposition: A | Discharge status: Home / Self Care | Payer: Commercial Managed Care - PPO | Source: Ambulatory Visit | Attending: Obstetrics and Gynecology | Admitting: Obstetrics and Gynecology

## 2021-07-14 ENCOUNTER — Encounter: Payer: Self-pay | Admitting: Obstetrics and Gynecology

## 2021-07-14 ENCOUNTER — Other Ambulatory Visit: Payer: Self-pay

## 2021-07-14 ENCOUNTER — Ambulatory Visit (INDEPENDENT_AMBULATORY_CARE_PROVIDER_SITE_OTHER): Payer: Commercial Managed Care - PPO | Admitting: Obstetrics and Gynecology

## 2021-07-14 VITALS — BP 124/80 | HR 78 | Ht 64.0 in | Wt 171.0 lb

## 2021-07-14 DIAGNOSIS — N841 Polyp of cervix uteri: Secondary | ICD-10-CM | POA: Diagnosis not present

## 2021-07-15 ENCOUNTER — Other Ambulatory Visit: Payer: Self-pay | Admitting: Family Medicine

## 2021-07-15 DIAGNOSIS — M1712 Unilateral primary osteoarthritis, left knee: Secondary | ICD-10-CM

## 2021-07-15 LAB — SURGICAL PATHOLOGY

## 2021-10-13 ENCOUNTER — Other Ambulatory Visit: Payer: Self-pay | Admitting: Family Medicine

## 2022-01-11 ENCOUNTER — Other Ambulatory Visit: Payer: Self-pay | Admitting: Family Medicine

## 2022-01-11 DIAGNOSIS — A6000 Herpesviral infection of urogenital system, unspecified: Secondary | ICD-10-CM

## 2022-01-27 ENCOUNTER — Telehealth: Payer: Commercial Managed Care - PPO | Admitting: Family Medicine

## 2022-01-27 DIAGNOSIS — B9689 Other specified bacterial agents as the cause of diseases classified elsewhere: Secondary | ICD-10-CM | POA: Diagnosis not present

## 2022-01-27 DIAGNOSIS — J019 Acute sinusitis, unspecified: Secondary | ICD-10-CM

## 2022-01-27 MED ORDER — AMOXICILLIN-POT CLAVULANATE 875-125 MG PO TABS: 1.0000 | ORAL_TABLET | Freq: Two times a day (BID) | ORAL | 0 refills | Status: DC

## 2022-01-27 NOTE — Progress Notes (Signed)
Virtual Visit Consent   Andrea Tran, you are scheduled for a virtual visit with a [facility] provider today. Just as with appointments in the office, your consent must be obtained to participate. Your consent will be active for this visit and any virtual visit you may have with one of our providers in the next 365 days. If you have a MyChart account, a copy of this consent can be sent to you electronically.  As this is a virtual visit, video technology does not allow for your provider to perform a traditional examination. This may limit your provider's ability to fully assess your condition. If your provider identifies any concerns that need to be evaluated in person or the need to arrange testing (such as labs, EKG, etc.), we will make arrangements to do so. Although advances in technology are sophisticated, we cannot ensure that it will always work on either your end or our end. If the connection with a video visit is poor, the visit may have to be switched to a telephone visit. With either a video or telephone visit, we are not always able to ensure that we have a secure connection.  By engaging in this virtual visit, you consent to the provision of healthcare and authorize for your insurance to be billed (if applicable) for the services provided during this visit. Depending on your insurance coverage, you may receive a charge related to this service.  I need to obtain your verbal consent now. Are you willing to proceed with your visit today? [name] has provided verbal consent on 01/27/2022 for a virtual visit (video or telephone). Dellia Nims, FNP  Date: 01/27/2022 8:07 AM  Virtual Visit via Video Note   I, Dellia Nims, connected with  Siham Bucaro  ([id number], 1965-08-23) on 01/27/22 at  8:00 AM EDT by a video-enabled telemedicine application and verified that I am speaking with the correct person using two identifiers.  Location: Patient: Virtual Visit Location Patient:  Home Provider: Virtual Visit Location Provider: Home Office   I discussed the limitations of evaluation and management by telemedicine and the availability of in person appointments. The patient expressed understanding and agreed to proceed.    History of Present Illness: Andrea Tran is a 56 y.o. who identifies as a female who was assigned female at birth, and is being seen today for cough, fever, maxillary sinus pressure and pain, loss of voice, yellow and green mucus now, seen yesterday given tessalon. Sx are worsening. Multiple covid tests at home neg. Marland Kitchen  HPI: HPI  Problems:  Patient Active Problem List   Diagnosis Date Noted   History of basal cell cancer 06/04/2019   Primary osteoarthritis of both knees 10/02/2018   Patellofemoral pain syndrome of both knees 10/02/2018   Chronic pain of both knees 10/02/2018   Perimenopausal symptoms 03/26/2018   Primary osteoarthritis of left knee 05/23/2016   Genital herpes 02/10/2013   HEMORRHOIDS 06/03/2010   NONSPEC ELEVATION OF LEVELS OF TRANSAMINASE/LDH 01/07/2009    Allergies:  Allergies  Allergen Reactions   Banana Swelling    Blisters in mouth   Medications:  Current Outpatient Medications:    amoxicillin-clavulanate (AUGMENTIN) 875-125 MG tablet, Take 1 tablet by mouth 2 (two) times daily., Disp: 20 tablet, Rfl: 0   meloxicam (MOBIC) 15 MG tablet, TAKE 1 TABLET DAILY, Disp: 90 tablet, Rfl: 3   valACYclovir (VALTREX) 500 MG tablet, TAKE 1 TABLET DAILY, Disp: 90 tablet, Rfl: 0  Observations/Objective: Patient is well-developed, well-nourished  in no acute distress.  Resting comfortably  at home.  Head is normocephalic, atraumatic.  No labored breathing.  Speech is clear and coherent with logical content.  Patient is alert and oriented at baseline.    Assessment and Plan: 1. Acute bacterial sinusitis  Increase fluids, ibuprofen as directed, med use and side effects discussed, proceed to urgent care or ED for worsening  sx.   Follow Up Instructions: I discussed the assessment and treatment plan with the patient. The patient was provided an opportunity to ask questions and all were answered. The patient agreed with the plan and demonstrated an understanding of the instructions.  A copy of instructions were sent to the patient via MyChart unless otherwise noted below.     The patient was advised to call back or seek an in-person evaluation if the symptoms worsen or if the condition fails to improve as anticipated.  Time:  I spent 10 minutes with the patient via telehealth technology discussing the above problems/concerns.    Dellia Nims, FNP

## 2022-01-27 NOTE — Patient Instructions (Signed)

## 2022-02-13 ENCOUNTER — Telehealth: Payer: Commercial Managed Care - PPO

## 2022-02-15 ENCOUNTER — Ambulatory Visit: Payer: Commercial Managed Care - PPO | Admitting: Family Medicine

## 2022-02-15 ENCOUNTER — Ambulatory Visit: Payer: Commercial Managed Care - PPO | Admitting: Sports Medicine

## 2022-04-11 ENCOUNTER — Other Ambulatory Visit: Payer: Self-pay | Admitting: Family Medicine

## 2022-04-11 DIAGNOSIS — A6000 Herpesviral infection of urogenital system, unspecified: Secondary | ICD-10-CM

## 2022-05-30 ENCOUNTER — Ambulatory Visit (INDEPENDENT_AMBULATORY_CARE_PROVIDER_SITE_OTHER): Payer: Commercial Managed Care - PPO | Admitting: Family Medicine

## 2022-05-30 VITALS — BP 138/78 | HR 78 | Ht 65.0 in | Wt 172.0 lb

## 2022-05-30 DIAGNOSIS — L989 Disorder of the skin and subcutaneous tissue, unspecified: Secondary | ICD-10-CM

## 2022-05-30 MED ORDER — MUPIROCIN 2 % EX OINT: TOPICAL_OINTMENT | Freq: Two times a day (BID) | CUTANEOUS | 0 refills | Status: DC

## 2022-05-30 MED ORDER — DOXYCYCLINE HYCLATE 100 MG PO TABS: 100.0000 mg | ORAL_TABLET | Freq: Two times a day (BID) | ORAL | 0 refills | Status: DC

## 2022-05-30 NOTE — Progress Notes (Signed)
   Acute Office Visit  Subjective:     Patient ID: Andrea Tran, female    DOB: January 18, 1966, 56 y.o.   MRN: [id number]  Chief Complaint  Patient presents with   Vaginal Pain    HPI Patient is in today for bump on vaginal area.  She noticed it about 2 days ago its not painful sore tender or burning.  She does have a history of genital herpes but has not had a breakout in years and she is currently on daily suppressive therapy with valacyclovir.  She has had a Bartholin cyst before but this feels different.  She did try to squeeze on the area so says it probably looks irritated on exam.  ROS      Objective:    BP 138/78   Pulse 78   Ht '5\' 5"'$  (1.651 m)   Wt 172 lb (78 kg)   LMP 07/14/2017 (Exact Date)   SpO2 100%   BMI 28.62 kg/m    Physical Exam Genitourinary:      Comments: Symptoms are looks white with an erythematous border.  It does feel somewhat papular.  But no active drainage.    No results found for any visits on 05/30/22.      Assessment & Plan:   Problem List Items Addressed This Visit   None Visit Diagnoses     Skin lesion    -  Primary   Relevant Medications   mupirocin ointment (BACTROBAN) 2 %      Genital lesion-on appearance it is most has a ulcerated look but it is more papular and she had been trying to express it.  So I think it is just inflamed at this point.  We discussed using mupirocin ointment on the area and if not improving or suddenly getting worse and okay to fill prescription for oral doxycycline.   Meds ordered this encounter  Medications   mupirocin ointment (BACTROBAN) 2 %    Sig: Apply topically 2 (two) times daily. X 10 days    Dispense:  30 g    Refill:  0   doxycycline (VIBRA-TABS) 100 MG tablet    Sig: Take 1 tablet (100 mg total) by mouth 2 (two) times daily.    Dispense:  14 tablet    Refill:  0    Pt will call when needed.    No follow-ups on file.  Beatrice Lecher, MD

## 2022-06-14 ENCOUNTER — Ambulatory Visit (INDEPENDENT_AMBULATORY_CARE_PROVIDER_SITE_OTHER): Payer: Commercial Managed Care - PPO | Admitting: Family Medicine

## 2022-06-14 ENCOUNTER — Encounter: Payer: Self-pay | Admitting: Family Medicine

## 2022-06-14 VITALS — BP 119/48 | HR 68 | Ht 65.0 in | Wt 167.0 lb

## 2022-06-14 DIAGNOSIS — Z0000 Encounter for general adult medical examination without abnormal findings: Secondary | ICD-10-CM | POA: Diagnosis not present

## 2022-06-14 DIAGNOSIS — Z77011 Contact with and (suspected) exposure to lead: Secondary | ICD-10-CM | POA: Diagnosis not present

## 2022-06-14 DIAGNOSIS — Z1231 Encounter for screening mammogram for malignant neoplasm of breast: Secondary | ICD-10-CM

## 2022-06-14 NOTE — Progress Notes (Signed)
Complete physical exam  Patient: Andrea Tran   DOB: February 12, 1966   56 y.o. Female  MRN: [id number]  Subjective:    Chief Complaint  Patient presents with   Annual Exam    Andrea Tran is a 56 y.o. female who presents today for a complete physical exam. She reports consuming a general diet.  Exercises 5 days per week  She generally feels well. She reports sleeping well. She does not have additional problems to discuss today.    Most recent fall risk assessment:    06/14/2022    8:36 AM  Fall Risk   Falls in the past year? 0  Number falls in past yr: 0  Injury with Fall? 0  Risk for fall due to : No Fall Risks  Follow up Falls evaluation completed     Most recent depression screenings:    06/14/2022    8:36 AM 06/13/2021    7:58 AM  PHQ 2/9 Scores  PHQ - 2 Score 0 0        Patient Care Team: Hali Marry, MD as PCP - General   Outpatient Medications Prior to Visit  Medication Sig   meloxicam (MOBIC) 15 MG tablet TAKE 1 TABLET DAILY   valACYclovir (VALTREX) 500 MG tablet TAKE 1 TABLET DAILY   [DISCONTINUED] doxycycline (VIBRA-TABS) 100 MG tablet Take 1 tablet (100 mg total) by mouth 2 (two) times daily.   [DISCONTINUED] mupirocin ointment (BACTROBAN) 2 % Apply topically 2 (two) times daily. X 10 days   No facility-administered medications prior to visit.    ROS        Objective:     BP (!) 119/48   Pulse 68   Ht '5\' 5"'$  (1.651 m)   Wt 167 lb (75.8 kg)   LMP 07/14/2017 (Exact Date)   SpO2 100%   BMI 27.79 kg/m    Physical Exam Vitals and nursing note reviewed. Exam conducted with a chaperone present.  Constitutional:      Appearance: She is well-developed.  HENT:     Head: Normocephalic and atraumatic.     Right Ear: Tympanic membrane, ear canal and external ear normal.     Left Ear: Tympanic membrane, ear canal and external ear normal.     Nose: Nose normal.     Mouth/Throat:     Pharynx: Oropharynx is clear.  Eyes:      Conjunctiva/sclera: Conjunctivae normal.     Pupils: Pupils are equal, round, and reactive to light.  Neck:     Thyroid: No thyromegaly.  Cardiovascular:     Rate and Rhythm: Normal rate and regular rhythm.     Heart sounds: Normal heart sounds.  Pulmonary:     Effort: Pulmonary effort is normal.     Breath sounds: Normal breath sounds. No wheezing.  Chest:     Chest wall: No mass.  Breasts:    Right: Normal.     Left: Normal.  Abdominal:     General: Bowel sounds are normal.     Palpations: Abdomen is soft.  Musculoskeletal:     Cervical back: Neck supple.  Lymphadenopathy:     Cervical: No cervical adenopathy.     Upper Body:     Right upper body: No supraclavicular, axillary or pectoral adenopathy.     Left upper body: No supraclavicular, axillary or pectoral adenopathy.  Skin:    General: Skin is warm and dry.     Coloration: Skin is not pale.  Neurological:  Mental Status: She is alert and oriented to person, place, and time.  Psychiatric:        Behavior: Behavior normal.      No results found for any visits on 06/14/22.     Assessment & Plan:    Routine Health Maintenance and Physical Exam  Immunization History  Administered Date(s) Administered   Hep A / Hep B 10/25/2016, 11/01/2016, 11/23/2016   Influenza,inj,Quad PF,6+ Mos 04/17/2022   PFIZER(Purple Top)SARS-COV-2 Vaccination 09/29/2019, 10/31/2019, 04/17/2022   Td 01/06/2009   Tdap 06/03/2019   Zoster Recombinat (Shingrix) 05/23/2017, 09/21/2017    Health Maintenance  Topic Date Due   COVID-19 Vaccine (4 - 2023-24 season) 06/12/2022   MAMMOGRAM  06/16/2023   PAP SMEAR-Modifier  06/13/2026   COLONOSCOPY (Pts 45-66yr Insurance coverage will need to be confirmed)  06/05/2027   INFLUENZA VACCINE  Completed   Hepatitis C Screening  Completed   HIV Screening  Completed   Zoster Vaccines- Shingrix  Completed   HPV VACCINES  Aged Out   Pneumococcal Vaccine 123621Years old  Discontinued     Discussed health benefits of physical activity, and encouraged her to engage in regular exercise appropriate for her age and condition.  Problem List Items Addressed This Visit   None Visit Diagnoses     Routine general medical examination at a health care facility    -  Primary   Relevant Orders   Lipid panel   COMPLETE METABOLIC PANEL WITH GFR   TSH   CBC   Screening mammogram for breast cancer       Relevant Orders   MM 3D SCREEN BREAST BILATERAL   Lead exposure       Relevant Orders   Lead, blood (adult age 2564yrs or greater)       Keep up a regular exercise program and make sure you are eating a healthy diet Try to eat 4 servings of dairy a day, or if you are lactose intolerant take a calcium with vitamin D daily.  Your vaccines are up to date.  Will get lead level today She will schedule mammogram.   No follow-ups on file.     CBeatrice Lecher MD

## 2022-06-15 NOTE — Progress Notes (Signed)
Hi Toni, cholesterol looks great this year.  Metabolic panel and thyroid also look great.  Count is normal.  Lead level still pending.

## 2022-06-16 LAB — COMPLETE METABOLIC PANEL WITH GFR
AG Ratio: 1.8 (calc) (ref 1.0–2.5)
ALT: 21 U/L (ref 6–29)
AST: 22 U/L (ref 10–35)
Albumin: 4.5 g/dL (ref 3.6–5.1)
Alkaline phosphatase (APISO): 90 U/L (ref 37–153)
BUN: 21 mg/dL (ref 7–25)
CO2: 29 mmol/L (ref 20–32)
Calcium: 9.8 mg/dL (ref 8.6–10.4)
Chloride: 103 mmol/L (ref 98–110)
Creat: 0.8 mg/dL (ref 0.50–1.03)
Globulin: 2.5 g/dL (calc) (ref 1.9–3.7)
Glucose, Bld: 83 mg/dL (ref 65–99)
Potassium: 4.1 mmol/L (ref 3.5–5.3)
Sodium: 142 mmol/L (ref 135–146)
Total Bilirubin: 0.7 mg/dL (ref 0.2–1.2)
Total Protein: 7 g/dL (ref 6.1–8.1)
eGFR: 87 mL/min/{1.73_m2} (ref >=60)

## 2022-06-16 LAB — CBC
HCT: 44.8 % (ref 35.0–45.0)
Hemoglobin: 15.3 g/dL (ref 11.7–15.5)
MCH: 31.5 pg (ref 27.0–33.0)
MCHC: 34.2 g/dL (ref 32.0–36.0)
MCV: 92.2 fL (ref 80.0–100.0)
MPV: 9.9 fL (ref 7.5–12.5)
Platelets: 295 10*3/uL (ref 140–400)
RBC: 4.86 10*6/uL (ref 3.80–5.10)
RDW: 12.4 % (ref 11.0–15.0)
WBC: 4.9 10*3/uL (ref 3.8–10.8)

## 2022-06-16 LAB — LIPID PANEL
Cholesterol: 182 mg/dL (ref <200)
HDL: 69 mg/dL (ref >=50)
LDL Cholesterol (Calc): 96 mg/dL (calc)
Non-HDL Cholesterol (Calc): 113 mg/dL (calc) (ref <130)
Total CHOL/HDL Ratio: 2.6 (calc) (ref <5.0)
Triglycerides: 79 mg/dL (ref <150)

## 2022-06-16 LAB — TSH: TSH: 1.76 mIU/L

## 2022-06-16 LAB — LEAD, BLOOD (ADULT >= 16 YRS): Lead: 14.4 ug/dL — ABNORMAL HIGH (ref <3.5)

## 2022-06-19 NOTE — Progress Notes (Signed)
[name], your lead level is actually going up, which makes me concerned that there may be another source of lead exposure.  Do you live in an older home?  Is there anything else that you think you could be coming in contact with?

## 2022-06-26 ENCOUNTER — Encounter: Payer: Self-pay | Admitting: Family Medicine

## 2022-06-26 NOTE — Progress Notes (Signed)
Hi Peaches, would there be a good time to call you this week may be over lunchtime?  I would like to chat about the options.  Let me know again if there is a certain day that would work better for you.

## 2022-06-28 NOTE — Telephone Encounter (Signed)
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  Wears gloves. More recently started wearing mask. Uses de-lead wipe.  Uses de-lead laundry detergent.   No neuropathy, no cognitive changes. Normal hemoglogin and renal function  Plan to recheck level in 3 mo

## 2022-07-10 ENCOUNTER — Other Ambulatory Visit: Payer: Self-pay | Admitting: Family Medicine

## 2022-07-10 DIAGNOSIS — M1712 Unilateral primary osteoarthritis, left knee: Secondary | ICD-10-CM

## 2022-10-02 ENCOUNTER — Other Ambulatory Visit: Payer: Self-pay | Admitting: Sports Medicine

## 2022-10-02 ENCOUNTER — Telehealth: Payer: Self-pay | Admitting: Family Medicine

## 2022-10-02 ENCOUNTER — Ambulatory Visit (INDEPENDENT_AMBULATORY_CARE_PROVIDER_SITE_OTHER): Payer: Commercial Managed Care - PPO

## 2022-10-02 ENCOUNTER — Ambulatory Visit (INDEPENDENT_AMBULATORY_CARE_PROVIDER_SITE_OTHER): Payer: Commercial Managed Care - PPO | Admitting: Sports Medicine

## 2022-10-02 DIAGNOSIS — M19012 Primary osteoarthritis, left shoulder: Secondary | ICD-10-CM | POA: Insufficient documentation

## 2022-10-02 DIAGNOSIS — M25521 Pain in right elbow: Secondary | ICD-10-CM

## 2022-10-02 DIAGNOSIS — M19021 Primary osteoarthritis, right elbow: Secondary | ICD-10-CM | POA: Insufficient documentation

## 2022-10-02 DIAGNOSIS — M25562 Pain in left knee: Secondary | ICD-10-CM

## 2022-10-02 DIAGNOSIS — Z09 Encounter for follow-up examination after completed treatment for conditions other than malignant neoplasm: Secondary | ICD-10-CM

## 2022-10-02 DIAGNOSIS — M1712 Unilateral primary osteoarthritis, left knee: Secondary | ICD-10-CM | POA: Diagnosis not present

## 2022-10-02 DIAGNOSIS — G8929 Other chronic pain: Secondary | ICD-10-CM | POA: Diagnosis not present

## 2022-10-02 DIAGNOSIS — R899 Unspecified abnormal finding in specimens from other organs, systems and tissues: Secondary | ICD-10-CM

## 2022-10-02 DIAGNOSIS — M25512 Pain in left shoulder: Secondary | ICD-10-CM

## 2022-10-02 NOTE — Assessment & Plan Note (Signed)
Pleasant 57 year old female, I last saw her 5 years ago for knee pain, she has increasing pain left knee under the kneecap with patellar crepitus, she will continue meloxicam, adding baseline x-rays, home physical therapy, return to see me in 6 weeks, injection if not better.

## 2022-10-02 NOTE — Telephone Encounter (Signed)
Pt informed

## 2022-10-02 NOTE — Assessment & Plan Note (Signed)
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 Exam is benign with the exception of some rotator cuff weakness, negative bicipital signs. Differential includes labral dysfunction versus biceps instability. Adding x-rays, home physical therapy, return to see me in 6 weeks, MR arthrography if not better.

## 2022-10-02 NOTE — Progress Notes (Signed)
    Procedures performed today:    None.  Independent interpretation of notes and tests performed by another provider:   None.  Brief History, Exam, Impression, and Recommendations:    Primary osteoarthritis of left knee Pleasant 57 year old female, I last saw her 5 years ago for knee pain, she has increasing pain left knee under the kneecap with patellar crepitus, she will continue meloxicam, adding baseline x-rays, home physical therapy, return to see me in 6 weeks, injection if not better.  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 Exam is benign with the exception of some rotator cuff weakness, negative bicipital signs. Differential includes labral dysfunction versus biceps instability. Adding x-rays, home physical therapy, return to see me in 6 weeks, MR arthrography if not better.  Right elbow pain Pain right elbow posterior aspect after olecranon trauma about 6 months ago. She does have some clicking when applying valgus and varus stress and terminal extension. Question intra-articular loose body. Adding x-rays, home conditioning, return to see me in 6 weeks, MRI if not better.  I spent 30 minutes of total time managing this patient today, this includes chart review, face to face, and non-face to face time.  ____________________________________________ Gwen Her. Dianah Field, M.D., ABFM., CAQSM., AME. Primary Care and Sports Medicine [facility]  Adjunct Professor of [employer] of Medicine  Risk manager

## 2022-10-02 NOTE — Assessment & Plan Note (Signed)
Pain right elbow posterior aspect after olecranon trauma about 6 months ago. She does have some clicking when applying valgus and varus stress and terminal extension. Question intra-articular loose body. Adding x-rays, home conditioning, return to see me in 6 weeks, MRI if not better.

## 2022-10-02 NOTE — Telephone Encounter (Signed)
Pt states Dr Madilyn Fireman and her discussed getting her lead levels rechecked. Can you place order for her to do so and let pt know? Thank you

## 2022-11-13 ENCOUNTER — Other Ambulatory Visit (INDEPENDENT_AMBULATORY_CARE_PROVIDER_SITE_OTHER): Payer: Commercial Managed Care - PPO

## 2022-11-13 ENCOUNTER — Ambulatory Visit (INDEPENDENT_AMBULATORY_CARE_PROVIDER_SITE_OTHER): Payer: Commercial Managed Care - PPO | Admitting: Sports Medicine

## 2022-11-13 DIAGNOSIS — M19021 Primary osteoarthritis, right elbow: Secondary | ICD-10-CM | POA: Diagnosis not present

## 2022-11-13 DIAGNOSIS — M1712 Unilateral primary osteoarthritis, left knee: Secondary | ICD-10-CM

## 2022-11-13 DIAGNOSIS — M19012 Primary osteoarthritis, left shoulder: Secondary | ICD-10-CM | POA: Diagnosis not present

## 2022-11-13 MED ORDER — TRIAMCINOLONE ACETONIDE 40 MG/ML IJ SUSP
40.0000 mg | Freq: Once | INTRAMUSCULAR | Status: AC
  Administered 2022-11-13: 40 mg via INTRAMUSCULAR

## 2022-11-13 NOTE — Assessment & Plan Note (Signed)
Right elbow pain has resolved with conservative treatment, no further intervention needed.

## 2022-11-13 NOTE — Assessment & Plan Note (Signed)
Persistent pain, injection as above, return to see me in about 6 weeks, if she does not improve we can get her approved for [facility]co.

## 2022-11-13 NOTE — Progress Notes (Signed)
    Procedures performed today:    Procedure: Real-time Ultrasound Guided injection of the left knee Device: Samsung HS60  Verbal informed consent obtained.  Time-out conducted.  Noted no overlying erythema, induration, or other signs of local infection.  Skin prepped in a sterile fashion.  Local anesthesia: Topical Ethyl chloride.  With sterile technique and under real time ultrasound guidance: Mild effusion noted, 1 cc Kenalog 40, 2 cc lidocaine, 2 cc bupivacaine injected easily Completed without difficulty  Advised to call if fevers/chills, erythema, induration, drainage, or persistent bleeding.  Images permanently stored and available for review in PACS.  Impression: Technically successful ultrasound guided injection.  Independent interpretation of notes and tests performed by another provider:   None.  Brief History, Exam, Impression, and Recommendations:    Primary osteoarthritis of left knee Persistent pain, injection as above, return to see me in about 6 weeks, if she does not improve we can get her approved for Visco.  Primary osteoarthritis of left shoulder Pain anteriorly with catching, x-ray did show moderate to severe glenohumeral osteoarthritis, at this point it does not hurt quite enough for intervention so we will watch this for now.  Primary osteoarthritis of right elbow Right elbow pain has resolved with conservative treatment, no further intervention needed.    ____________________________________________ Ihor Austin. Benjamin Stain, M.D., ABFM., CAQSM., AME. Primary Care and Sports Medicine [facility]  Adjunct Professor of Family Medicine  [employer] [facility] [employer]  Restaurant manager, fast food

## 2022-11-13 NOTE — Assessment & Plan Note (Signed)
Pain anteriorly with catching, x-ray did show moderate to severe glenohumeral osteoarthritis, at this point it does not hurt quite enough for intervention so we will watch this for now.

## 2022-11-15 LAB — LEAD, BLOOD (ADULT >= 16 YRS): Lead: 10.2 ug/dL — ABNORMAL HIGH (ref <3.5)

## 2022-11-15 NOTE — Progress Notes (Signed)
Back down to 10.2, so that is good.

## 2022-12-25 ENCOUNTER — Ambulatory Visit (INDEPENDENT_AMBULATORY_CARE_PROVIDER_SITE_OTHER): Payer: Commercial Managed Care - PPO

## 2022-12-25 ENCOUNTER — Ambulatory Visit (INDEPENDENT_AMBULATORY_CARE_PROVIDER_SITE_OTHER): Payer: Commercial Managed Care - PPO | Admitting: Sports Medicine

## 2022-12-25 ENCOUNTER — Other Ambulatory Visit (INDEPENDENT_AMBULATORY_CARE_PROVIDER_SITE_OTHER): Payer: Commercial Managed Care - PPO

## 2022-12-25 DIAGNOSIS — M79662 Pain in left lower leg: Secondary | ICD-10-CM

## 2022-12-25 DIAGNOSIS — M19012 Primary osteoarthritis, left shoulder: Secondary | ICD-10-CM

## 2022-12-25 DIAGNOSIS — M1712 Unilateral primary osteoarthritis, left knee: Secondary | ICD-10-CM

## 2022-12-25 MED ORDER — GABAPENTIN 300 MG PO CAPS: 300.0000 mg | ORAL_CAPSULE | Freq: Every day | ORAL | 3 refills | Status: DC

## 2022-12-25 NOTE — Assessment & Plan Note (Signed)
Pain-free after injection at the last visit.

## 2022-12-25 NOTE — Assessment & Plan Note (Signed)
Unclear etiology, has been doing a lot of working out. She does have some tightness in the back and discomfort is predominantly anterior/medial tibia at night. We will get x-rays of the lumbar spine, tib-fib, adding tibialis posterior and lumbar spine conditioning, Neurontin at night, return to see me in 6 to 8 weeks for this.

## 2022-12-25 NOTE — Progress Notes (Signed)
    Procedures performed today:    Procedure: Real-time Ultrasound Guided injection of the left glenohumeral joint Device: Samsung HS60  Verbal informed consent obtained.  Time-out conducted.  Noted no overlying erythema, induration, or other signs of local infection.  Skin prepped in a sterile fashion.  Local anesthesia: Topical Ethyl chloride.  With sterile technique and under real time ultrasound guidance: Arthritic joint 1 cc Kenalog 40, 2 cc lidocaine, 2 cc bupivacaine injected easily Completed without difficulty  Advised to call if fevers/chills, erythema, induration, drainage, or persistent bleeding.  Images permanently stored and available for review in PACS.  Impression: Technically successful ultrasound guided injection.  Independent interpretation of notes and tests performed by another provider:   None.  Brief History, Exam, Impression, and Recommendations:    Primary osteoarthritis of left knee Pain-free after injection at the last visit.  Primary osteoarthritis of left shoulder Increasing pain, severe osteoarthritis on x-rays, glenohumeral signs on exam, glenohumeral joint injection as above, return to see me in 6 to 8 weeks.  Pain in left shin Unclear etiology, has been doing a lot of working out. She does have some tightness in the back and discomfort is predominantly anterior/medial tibia at night. We will get x-rays of the lumbar spine, tib-fib, adding tibialis posterior and lumbar spine conditioning, Neurontin at night, return to see me in 6 to 8 weeks for this.    ____________________________________________ Ihor Austin. Benjamin Stain, M.D., ABFM., CAQSM., AME. Primary Care and Sports Medicine [facility]  Adjunct Professor of Family Medicine  [place] [employer] [facility] [employer]  Restaurant manager, fast food

## 2022-12-25 NOTE — Assessment & Plan Note (Signed)
Increasing pain, severe osteoarthritis on x-rays, glenohumeral signs on exam, glenohumeral joint injection as above, return to see me in 6 to 8 weeks.

## 2022-12-28 ENCOUNTER — Encounter: Payer: Self-pay | Admitting: Sports Medicine

## 2023-02-05 ENCOUNTER — Encounter: Payer: Self-pay | Admitting: Sports Medicine

## 2023-02-05 ENCOUNTER — Ambulatory Visit (INDEPENDENT_AMBULATORY_CARE_PROVIDER_SITE_OTHER): Payer: Commercial Managed Care - PPO | Admitting: Sports Medicine

## 2023-02-05 DIAGNOSIS — M19012 Primary osteoarthritis, left shoulder: Secondary | ICD-10-CM

## 2023-02-05 DIAGNOSIS — M79672 Pain in left foot: Secondary | ICD-10-CM

## 2023-02-05 DIAGNOSIS — M79671 Pain in right foot: Secondary | ICD-10-CM | POA: Diagnosis not present

## 2023-02-05 NOTE — Assessment & Plan Note (Signed)
Severe shoulder osteoarthritis, glenohumeral joint injection back in June, she has done extremely well, return as needed.

## 2023-02-05 NOTE — Assessment & Plan Note (Signed)
Bilateral foot pain, she is ambulatory, she will benefit from cushioned custom molded orthotics. Referral placed.

## 2023-02-05 NOTE — Progress Notes (Signed)
    Procedures performed today:    None.  Independent interpretation of notes and tests performed by another provider:   None.  Brief History, Exam, Impression, and Recommendations:    Bilateral foot pain Bilateral foot pain, she is ambulatory, she will benefit from cushioned custom molded orthotics. Referral placed.  Primary osteoarthritis of left shoulder Severe shoulder osteoarthritis, glenohumeral joint injection back in June, she has done extremely well, return as needed.    ____________________________________________ Ihor Austin. Benjamin Stain, M.D., ABFM., CAQSM., AME. Primary Care and Sports Medicine [facility]  Adjunct Professor of Family Medicine  [employer] [facility] [employer]  Restaurant manager, fast food

## 2023-04-06 ENCOUNTER — Other Ambulatory Visit: Payer: Self-pay | Admitting: Family Medicine

## 2023-04-06 DIAGNOSIS — A6000 Herpesviral infection of urogenital system, unspecified: Secondary | ICD-10-CM

## 2023-07-05 ENCOUNTER — Encounter: Payer: Self-pay | Admitting: Family Medicine

## 2023-07-05 ENCOUNTER — Ambulatory Visit (INDEPENDENT_AMBULATORY_CARE_PROVIDER_SITE_OTHER): Payer: Commercial Managed Care - PPO | Admitting: Family Medicine

## 2023-07-05 ENCOUNTER — Other Ambulatory Visit: Payer: Self-pay | Admitting: Family Medicine

## 2023-07-05 VITALS — BP 122/52 | HR 75 | Ht 65.0 in | Wt 179.0 lb

## 2023-07-05 DIAGNOSIS — Z77011 Contact with and (suspected) exposure to lead: Secondary | ICD-10-CM

## 2023-07-05 DIAGNOSIS — Z23 Encounter for immunization: Secondary | ICD-10-CM | POA: Diagnosis not present

## 2023-07-05 DIAGNOSIS — Z0000 Encounter for general adult medical examination without abnormal findings: Secondary | ICD-10-CM | POA: Diagnosis not present

## 2023-07-05 DIAGNOSIS — M1712 Unilateral primary osteoarthritis, left knee: Secondary | ICD-10-CM

## 2023-07-05 NOTE — Progress Notes (Signed)
Complete physical exam  Patient: Andrea Tran   DOB: 08/10/1965   57 y.o. Female  MRN: [id number]  Subjective:    Chief Complaint  Patient presents with   Annual Exam    Andrea Tran is a 57 y.o. female who presents today for a complete physical exam. She reports consuming a general diet.  Works out at [employer] 5 days per week  She generally feels well. She reports sleeping well. She does not have additional problems to discuss today.    Most recent fall risk assessment:    06/14/2022    8:36 AM  Fall Risk   Falls in the past year? 0  Number falls in past yr: 0  Injury with Fall? 0  Risk for fall due to : No Fall Risks  Follow up Falls evaluation completed     Most recent depression screenings:    06/14/2022    8:36 AM 06/13/2021    7:58 AM  PHQ 2/9 Scores  PHQ - 2 Score 0 0        Patient Care Team: Agapito Games, MD as PCP - General   Outpatient Medications Prior to Visit  Medication Sig   gabapentin (NEURONTIN) 300 MG capsule Take 1 capsule (300 mg total) by mouth at bedtime.   meloxicam (MOBIC) 15 MG tablet TAKE 1 TABLET DAILY   valACYclovir (VALTREX) 500 MG tablet TAKE 1 TABLET DAILY   No facility-administered medications prior to visit.    ROS        Objective:     BP (!) 122/52   Pulse 75   Ht 5\' 5"  (1.651 m)   Wt 179 lb (81.2 kg)   LMP 07/14/2017 (Exact Date)   SpO2 100%   BMI 29.79 kg/m    Physical Exam Exam conducted with a chaperone present.  Constitutional:      Appearance: Normal appearance.  HENT:     Head: Normocephalic and atraumatic.     Right Ear: Tympanic membrane, ear canal and external ear normal.     Left Ear: Tympanic membrane, ear canal and external ear normal.     Nose: Nose normal.     Mouth/Throat:     Pharynx: Oropharynx is clear.  Eyes:     Extraocular Movements: Extraocular movements intact.     Conjunctiva/sclera: Conjunctivae normal.     Pupils: Pupils are equal, round, and reactive to  light.  Neck:     Thyroid: No thyromegaly.  Cardiovascular:     Rate and Rhythm: Normal rate and regular rhythm.  Pulmonary:     Effort: Pulmonary effort is normal.     Breath sounds: Normal breath sounds.  Chest:     Chest wall: No mass.  Breasts:    Right: Normal. No mass, nipple discharge or skin change.     Left: Normal. No mass, nipple discharge or skin change.  Abdominal:     General: Bowel sounds are normal.     Palpations: Abdomen is soft.     Tenderness: There is no abdominal tenderness.  Musculoskeletal:        General: No swelling.     Cervical back: Neck supple.  Lymphadenopathy:     Upper Body:     Right upper body: No supraclavicular, axillary or pectoral adenopathy.     Left upper body: No supraclavicular, axillary or pectoral adenopathy.  Skin:    General: Skin is warm and dry.  Neurological:     Mental Status: She  is oriented to person, place, and time.  Psychiatric:        Mood and Affect: Mood normal.        Behavior: Behavior normal.      No results found for any visits on 07/05/23.     Assessment & Plan:    Routine Health Maintenance and Physical Exam  Immunization History  Administered Date(s) Administered   Hep A / Hep B 10/25/2016, 11/01/2016, 11/23/2016   Influenza, Seasonal, Injecte, Preservative Fre 07/05/2023   Influenza,inj,Quad PF,6+ Mos 04/17/2022   PFIZER(Purple Top)SARS-COV-2 Vaccination 09/29/2019, 10/31/2019, 04/17/2022   Pfizer(Comirnaty)Fall Seasonal Vaccine 12 years and older 07/05/2023   Td 01/06/2009   Tdap 06/03/2019   Zoster Recombinant(Shingrix) 05/23/2017, 09/21/2017    Health Maintenance  Topic Date Due   MAMMOGRAM  08/18/2023 (Originally 06/16/2023)   COVID-19 Vaccine (5 - 2024-25 season) 08/30/2023   Cervical Cancer Screening (HPV/Pap Cotest)  06/13/2026   Colonoscopy  06/05/2027   DTaP/Tdap/Td (3 - Td or Tdap) 06/02/2029   INFLUENZA VACCINE  Completed   Hepatitis C Screening  Completed   HIV Screening   Completed   Zoster Vaccines- Shingrix  Completed   HPV VACCINES  Aged Out    Discussed health benefits of physical activity, and encouraged her to engage in regular exercise appropriate for her age and condition.  Problem List Items Addressed This Visit   None Visit Diagnoses       Encounter for immunization    -  Primary   Relevant Orders   Flu vaccine trivalent PF, 6mos and older(Flulaval,Afluria,Fluarix,Fluzone) (Completed)   Pfizer Comirnaty Covid-19 Vaccine 37yrs & older (Completed)     Wellness examination       Relevant Orders   Lipid Panel With LDL/HDL Ratio   CMP14+EGFR   CBC   Lead, blood (adult age 69 yrs or greater)   TSH   Vitamin D (25 hydroxy)     Lead exposure       Relevant Orders   Lead, blood (adult age 27 yrs or greater)       Keep up a regular exercise program and make sure you are eating a healthy diet Try to eat 4 servings of dairy a day, or if you are lactose intolerant take a calcium with vitamin D daily.  Your vaccines are up to date.   Return in about 1 year (around 07/04/2024) for Wellness Exam.     Nani Gasser, MD

## 2023-07-06 NOTE — Progress Notes (Signed)
Hi Andrea Tran, your LDL cholesterol is up a little at 116, Goal is under 100. Metabolic panel is normal.  Thyroid looks great!! Vitamin D is low again.  Recommend 1000 IU/54mcg daily. Blood count is normal.

## 2023-07-08 ENCOUNTER — Encounter: Payer: Self-pay | Admitting: Family Medicine

## 2023-07-09 LAB — CMP14+EGFR
ALT: 18 [IU]/L (ref 0–32)
AST: 20 [IU]/L (ref 0–40)
Albumin: 4.3 g/dL (ref 3.8–4.9)
Alkaline Phosphatase: 107 [IU]/L (ref 44–121)
BUN/Creatinine Ratio: 26 — ABNORMAL HIGH (ref 9–23)
BUN: 20 mg/dL (ref 6–24)
Bilirubin Total: 0.3 mg/dL (ref 0.0–1.2)
CO2: 25 mmol/L (ref 20–29)
Calcium: 9.1 mg/dL (ref 8.7–10.2)
Chloride: 104 mmol/L (ref 96–106)
Creatinine, Ser: 0.76 mg/dL (ref 0.57–1.00)
Globulin, Total: 2.1 g/dL (ref 1.5–4.5)
Glucose: 92 mg/dL (ref 70–99)
Potassium: 4.5 mmol/L (ref 3.5–5.2)
Sodium: 142 mmol/L (ref 134–144)
Total Protein: 6.4 g/dL (ref 6.0–8.5)
eGFR: 92 mL/min/{1.73_m2} (ref >59)

## 2023-07-09 LAB — CBC
Hematocrit: 41.1 % (ref 34.0–46.6)
Hemoglobin: 13.6 g/dL (ref 11.1–15.9)
MCH: 30.9 pg (ref 26.6–33.0)
MCHC: 33.1 g/dL (ref 31.5–35.7)
MCV: 93 fL (ref 79–97)
Platelets: 299 10*3/uL (ref 150–450)
RBC: 4.4 x10E6/uL (ref 3.77–5.28)
RDW: 12.6 % (ref 11.7–15.4)
WBC: 4.5 10*3/uL (ref 3.4–10.8)

## 2023-07-09 LAB — LIPID PANEL WITH LDL/HDL RATIO
Cholesterol, Total: 192 mg/dL (ref 100–199)
HDL: 64 mg/dL (ref >39)
LDL Chol Calc (NIH): 116 mg/dL — ABNORMAL HIGH (ref 0–99)
LDL/HDL Ratio: 1.8 {ratio} (ref 0.0–3.2)
Triglycerides: 66 mg/dL (ref 0–149)
VLDL Cholesterol Cal: 12 mg/dL (ref 5–40)

## 2023-07-09 LAB — TSH: TSH: 1.71 u[IU]/mL (ref 0.450–4.500)

## 2023-07-09 LAB — LEAD, BLOOD (ADULT >= 16 YRS): Lead-Whole Blood: 13 ug/dL — ABNORMAL HIGH (ref 0.0–3.4)

## 2023-07-09 LAB — VITAMIN D 25 HYDROXY (VIT D DEFICIENCY, FRACTURES): Vit D, 25-Hydroxy: 35.4 ng/mL (ref 30.0–100.0)

## 2023-07-09 NOTE — Progress Notes (Signed)
Hi Andrea Tran, the lead level is up just slightly.  Remember we did switch labs from previously [facility] to [facility] so there could be a slight difference in the detection on the test.  You have really changed how you are doing things to reduce your exposure.

## 2023-08-23 ENCOUNTER — Other Ambulatory Visit: Payer: Self-pay | Admitting: Family Medicine

## 2023-08-23 DIAGNOSIS — Z1231 Encounter for screening mammogram for malignant neoplasm of breast: Secondary | ICD-10-CM

## 2023-09-05 DIAGNOSIS — Z1231 Encounter for screening mammogram for malignant neoplasm of breast: Secondary | ICD-10-CM

## 2023-09-13 ENCOUNTER — Ambulatory Visit: Payer: Commercial Managed Care - PPO

## 2023-09-13 DIAGNOSIS — Z1231 Encounter for screening mammogram for malignant neoplasm of breast: Secondary | ICD-10-CM | POA: Diagnosis not present

## 2023-09-18 ENCOUNTER — Encounter: Payer: Self-pay | Admitting: Family Medicine

## 2023-09-18 NOTE — Progress Notes (Signed)
 Please call patient. Normal mammogram.  Repeat in 1 year.

## 2023-09-27 ENCOUNTER — Other Ambulatory Visit: Payer: Self-pay | Admitting: Occupational Medicine

## 2023-09-27 ENCOUNTER — Ambulatory Visit (INDEPENDENT_AMBULATORY_CARE_PROVIDER_SITE_OTHER): Payer: Self-pay

## 2023-09-27 DIAGNOSIS — R079 Chest pain, unspecified: Secondary | ICD-10-CM

## 2023-09-27 DIAGNOSIS — R0789 Other chest pain: Secondary | ICD-10-CM

## 2024-03-18 ENCOUNTER — Encounter: Payer: Self-pay | Admitting: Sports Medicine

## 2024-03-31 ENCOUNTER — Other Ambulatory Visit: Payer: Self-pay | Admitting: Family Medicine

## 2024-03-31 DIAGNOSIS — A6000 Herpesviral infection of urogenital system, unspecified: Secondary | ICD-10-CM

## 2024-05-07 ENCOUNTER — Ambulatory Visit: Payer: Self-pay

## 2024-05-07 ENCOUNTER — Ambulatory Visit (HOSPITAL_BASED_OUTPATIENT_CLINIC_OR_DEPARTMENT_OTHER)
Admission: RE | Admit: 2024-05-07 | Discharge: 2024-05-07 | Disposition: A | Discharge status: Home / Self Care | Source: Ambulatory Visit

## 2024-05-07 ENCOUNTER — Ambulatory Visit

## 2024-05-07 VITALS — BP 108/70 | Ht 65.0 in | Wt 162.0 lb

## 2024-05-07 DIAGNOSIS — M25511 Pain in right shoulder: Secondary | ICD-10-CM | POA: Diagnosis present

## 2024-05-07 DIAGNOSIS — M7711 Lateral epicondylitis, right elbow: Secondary | ICD-10-CM

## 2024-05-07 DIAGNOSIS — M79631 Pain in right forearm: Secondary | ICD-10-CM | POA: Diagnosis not present

## 2024-05-07 DIAGNOSIS — G8929 Other chronic pain: Secondary | ICD-10-CM | POA: Diagnosis present

## 2024-05-07 DIAGNOSIS — M7541 Impingement syndrome of right shoulder: Secondary | ICD-10-CM

## 2024-05-07 MED ORDER — DICLOFENAC SODIUM 75 MG PO TBEC: 75.0000 mg | DELAYED_RELEASE_TABLET | Freq: Two times a day (BID) | ORAL | 1 refills | Status: AC
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
## 2024-05-08 ENCOUNTER — Ambulatory Visit (INDEPENDENT_AMBULATORY_CARE_PROVIDER_SITE_OTHER): Payer: Self-pay

## 2024-05-08 VITALS — Ht 65.0 in | Wt 162.0 lb

## 2024-05-08 DIAGNOSIS — M7711 Lateral epicondylitis, right elbow: Secondary | ICD-10-CM

## 2024-05-08 NOTE — Progress Notes (Signed)
   Subjective:    Patient ID: Andrea Tran, female    DOB: 58 y.o., 1966/02/03   MRN: [id number]  Chief Complaint: R lateral epicondylitis ESWT #1  History of Present Illness  Patient presenting today for shockwave treatment #1.   Has had continued pain since last visit yesterday. 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    Objective:   There were no vitals filed for this visit.  Extracorporeal Shockwave Therapy Procedure Following the description of risks including pain, bruising, local skin irritation, damage to surrounding structures, patient provided verbal/written consent for ESWT procedure. Palpation was used to identify the right common extensor tendon. Patient and probe was sterilely prepped in the usual fashion with alcohol.  Total strikes: 2000 Intensity: 100 Frequency: 10  Patient tolerated well without complication. Precautions provided.      Assessment & Plan:   Patient tolerated the procedure well today.  Follow-up in 1 week for repeat shockwave treatment.  Recommended holding on NSAIDs until shockwave treatments have been completed.

## 2024-05-12 ENCOUNTER — Ambulatory Visit (INDEPENDENT_AMBULATORY_CARE_PROVIDER_SITE_OTHER): Payer: Self-pay

## 2024-05-12 VITALS — Ht 65.0 in | Wt 162.0 lb

## 2024-05-12 DIAGNOSIS — M7711 Lateral epicondylitis, right elbow: Secondary | ICD-10-CM

## 2024-05-12 NOTE — Progress Notes (Signed)
   Subjective:    Patient ID: Andrea Tran, female    DOB: 58 y.o., 08/21/1965   MRN: [id number]  Chief Complaint: R lateral epicondylitis ESWT #2  History of Present Illness  Patient presenting today for shockwave treatment #2 for right lateral epicondylitis.  Reporting approximately 10% improvement.     Objective:   There were no vitals filed for this visit.  Extracorporeal Shockwave Therapy Procedure Following the description of risks including pain, bruising, local skin irritation, damage to surrounding structures, patient provided verbal/written consent for ESWT procedure. Palpation was used to identify the right common extensor tendon. Patient and probe was sterilely prepped in the usual fashion with alcohol.  Total strikes: 2000 Intensity: 100 Frequency: 10  Patient tolerated well without complication. Precautions provided.      Assessment & Plan:   Patient tolerated the procedure well today.  Follow-up in 1 week for shockwave treatment #3.  Recommended holding on NSAIDs until shockwave treatments have been completed.

## 2024-05-20 ENCOUNTER — Ambulatory Visit

## 2024-05-22 ENCOUNTER — Ambulatory Visit

## 2024-05-23 ENCOUNTER — Ambulatory Visit (INDEPENDENT_AMBULATORY_CARE_PROVIDER_SITE_OTHER): Payer: Self-pay

## 2024-05-23 VITALS — Ht 65.0 in | Wt 162.0 lb

## 2024-05-23 DIAGNOSIS — M7711 Lateral epicondylitis, right elbow: Secondary | ICD-10-CM

## 2024-05-23 NOTE — Progress Notes (Signed)
   Subjective:    Patient ID: Andrea Tran, female    DOB: 58 y.o., 11-25-1965   MRN: [id number]  Chief Complaint: R lateral epicondylitis ESWT #3  History of Present Illness  Patient presenting today for shockwave treatment #3 for right lateral epicondylitis.  Reporting approximately 40-50% improvement. He is reporting the emergence of some pain slightly further distal, forearm     Objective:   There were no vitals filed for this visit.  Extracorporeal Shockwave Therapy Procedure Following the description of risks including pain, bruising, local skin irritation, damage to surrounding structures, patient provided verbal/written consent for ESWT procedure. Palpation was used to identify the right common extensor tendon. Patient and probe was sterilely prepped in the usual fashion with alcohol.  Total strikes: 2000 Intensity: 100 Frequency: 10  Patient tolerated well without complication. Precautions provided.      Assessment & Plan:   Patient tolerated the procedure well today.  Follow-up in 1 week for shockwave treatment #4.  Recommended holding on NSAIDs, alcohol until shockwave treatments have been completed.

## 2024-05-28 ENCOUNTER — Ambulatory Visit (INDEPENDENT_AMBULATORY_CARE_PROVIDER_SITE_OTHER): Payer: Self-pay

## 2024-05-28 DIAGNOSIS — M7711 Lateral epicondylitis, right elbow: Secondary | ICD-10-CM

## 2024-05-28 NOTE — Progress Notes (Signed)
   Subjective:    Patient ID: Andrea Tran, female    DOB: 58 y.o., September 19, 1965   MRN: [id number]  Chief Complaint: R lateral epicondylitis ESWT #3  History of Present Illness  Patient presenting today for shockwave treatment #4 for right lateral epicondylitis.  Reporting approximately 60% improvement. He is reporting the emergence of some pain slightly further distal, forearm     Objective:   There were no vitals filed for this visit.  Extracorporeal Shockwave Therapy Procedure Following the description of risks including pain, bruising, local skin irritation, damage to surrounding structures, patient provided verbal/written consent for ESWT procedure. Palpation was used to identify the right common extensor tendon. Patient and probe was sterilely prepped in the usual fashion with alcohol.  Total strikes: 2000 Intensity: 120 Frequency: 10  Patient tolerated well without complication. Precautions provided.      Assessment & Plan:   Patient tolerated the procedure well today.  Follow-up in 1 week for shockwave treatment #5.  Recommended holding on NSAIDs, alcohol until shockwave treatments have been completed.

## 2024-06-06 ENCOUNTER — Ambulatory Visit: Payer: Self-pay

## 2024-06-06 DIAGNOSIS — M7711 Lateral epicondylitis, right elbow: Secondary | ICD-10-CM

## 2024-06-06 DIAGNOSIS — M79631 Pain in right forearm: Secondary | ICD-10-CM

## 2024-06-06 NOTE — Progress Notes (Signed)
   Subjective:    Patient ID: Andrea Tran, female    DOB: 58 y.o., 04/29/1966   MRN: [id number]  Chief Complaint: R lateral epicondylitis ESWT #5  History of Present Illness  Patient presenting today for shockwave treatment #5 for right lateral epicondylitis.  Reporting approximately 90% improvement. She is reporting the emergence of some pain slightly further distal, forearm     Objective:   There were no vitals filed for this visit.  Extracorporeal Shockwave Therapy Procedure Following the description of risks including pain, bruising, local skin irritation, damage to surrounding structures, patient provided verbal/written consent for ESWT procedure. Palpation was used to identify the right common extensor tendon. Patient and probe was sterilely prepped in the usual fashion with alcohol.  Total strikes: 2000 Intensity: 120 Frequency: 12  Patient tolerated well without complication. Precautions provided.      Assessment & Plan:   Patient tolerated the procedure well today.  Follow-up in 1 week for shockwave treatment #6 which I suspect will be her last.  Continue physical therapy and home exercises.

## 2024-06-19 ENCOUNTER — Ambulatory Visit

## 2024-06-19 DIAGNOSIS — M7711 Lateral epicondylitis, right elbow: Secondary | ICD-10-CM

## 2024-06-19 NOTE — Progress Notes (Signed)
   Subjective:    Patient ID: Andrea Tran, female    DOB: 58 y.o., 1966/05/28   MRN: [id number]  Chief Complaint: R lateral epicondylitis ESWT #6  History of Present Illness  Patient presenting today for shockwave treatment #6 for right lateral epicondylitis.  Reporting approximately 95% improvement.     Objective:   There were no vitals filed for this visit.  Extracorporeal Shockwave Therapy Procedure Following the description of risks including pain, bruising, local skin irritation, damage to surrounding structures, patient provided verbal/written consent for ESWT procedure. Palpation was used to identify the right common extensor tendon. Patient and probe was sterilely prepped in the usual fashion with alcohol.  Total strikes: 2000 Intensity: 120 Frequency: 12  Patient tolerated well without complication. Precautions provided.      Assessment & Plan:   Patient tolerated the procedure well today and has continued to show excellent improvement in pain and function. No further shockwave therapy sessions required. Continue physical therapy and home exercises.

## 2024-07-07 ENCOUNTER — Encounter: Admitting: Family Medicine

## 2024-07-07 VITALS — BP 126/65 | HR 69 | Ht 65.0 in | Wt 166.7 lb

## 2024-07-07 DIAGNOSIS — Z23 Encounter for immunization: Secondary | ICD-10-CM | POA: Diagnosis not present

## 2024-07-07 DIAGNOSIS — Z0000 Encounter for general adult medical examination without abnormal findings: Secondary | ICD-10-CM

## 2024-07-07 NOTE — Progress Notes (Signed)
 "  Complete physical exam  Patient: Andrea Brossman BruntonFemale    DOB: [birth year] 58 y.o.   MRN: [id number]  Chief Complaint  Patient presents with   Annual Exam    Subjective:    Andrea Tran is a 58 y.o. female who presents today for a complete physical exam. She reports consuming a general diet. The patient does not participate in regular exercise at present. She generally feels well.  She does not have additional problems to discuss today.   Discussed the use of AI scribe software for clinical note transcription with the patient, who gave verbal consent to proceed.  History of Present Illness Andrea Tran is a 57 year old female who presents for health maintenance and vaccination update.  Colorectal cancer screening - Last colonoscopy performed at age 74 - Hopes for a clear result on next colonoscopy to extend interval to ten years  Immunization status and tolerance - Considering receiving the pneumonia vaccine today - No adverse reactions to previous vaccines, including COVID and influenza vaccines - Planning travel for Christmas  Covid-19 infection history - Two prior COVID-19 infections, both milder than previous sinus infections - No severe symptoms with either infection - Perceives current COVID-19 strains as similar to a bad cold  Dermatologic health - No current skin issues since previous nasal procedure - Regular use of sunscreen - Acknowledges history of significant sun exposure and emphasizes importance of sun protection   Most recent fall risk assessment:    07/07/2024    8:01 AM  Fall Risk   Falls in the past year? 0  Number falls in past yr: 0  Injury with Fall? 0  Risk for fall due to : No Fall Risks  Follow up Falls evaluation completed     Most recent depression screenings:    07/07/2024    8:01 AM 06/14/2022    8:36 AM  PHQ 2/9 Scores  PHQ - 2 Score 0 0        Patient Care Team: Alvan Dorothyann BIRCH, MD as PCP - General   ROS     Objective:    BP 126/65   Pulse 69   Ht 5' 5 (1.651 m)   Wt 166 lb 11.2 oz (75.6 kg)   LMP 07/14/2017   SpO2 98%   BMI 27.74 kg/m     Physical Exam Constitutional:      Appearance: Normal appearance.  HENT:     Head: Normocephalic and atraumatic.     Right Ear: Tympanic membrane, ear canal and external ear normal.     Left Ear: Tympanic membrane, ear canal and external ear normal.     Nose: Nose normal.     Mouth/Throat:     Pharynx: Oropharynx is clear.  Eyes:     Extraocular Movements: Extraocular movements intact.     Conjunctiva/sclera: Conjunctivae normal.     Pupils: Pupils are equal, round, and reactive to light.  Neck:     Thyroid: No thyromegaly.  Cardiovascular:     Rate and Rhythm: Normal rate and regular rhythm.  Pulmonary:     Effort: Pulmonary effort is normal.     Breath sounds: Normal breath sounds.  Abdominal:     General: Bowel sounds are normal.     Palpations: Abdomen is soft.     Tenderness: There is no abdominal tenderness.  Musculoskeletal:        General: No swelling.     Cervical back: Neck supple.  Skin:  General: Skin is warm and dry.  Neurological:     Mental Status: She is oriented to person, place, and time.  Psychiatric:        Mood and Affect: Mood normal.        Behavior: Behavior normal.       No results found for any visits on 07/07/24.       Assessment & Plan:    Routine Health Maintenance and Physical Exam Immunization History  Administered Date(s) Administered   Hep A / Hep B 10/25/2016, 11/01/2016, 11/23/2016   Influenza, Seasonal, Injecte, Preservative Fre 07/05/2023   Influenza,inj,Quad PF,6+ Mos 04/17/2022   Influenza-Unspecified 04/28/2024   PFIZER(Purple Top)SARS-COV-2 Vaccination 09/29/2019, 10/31/2019, 04/17/2022   PNEUMOCOCCAL CONJUGATE-20 07/07/2024   Pfizer(Comirnaty)Fall Seasonal Vaccine 12 years and older 07/05/2023   Td 01/06/2009   Tdap 06/03/2019   Zoster Recombinant(Shingrix ) 05/23/2017,  09/21/2017    Health Maintenance  Topic Date Due   Hepatitis B Vaccines 19-59 Average Risk (2 of 3 - 19+ 3-dose series) 12/21/2016   COVID-19 Vaccine (5 - 2025-26 season) 03/17/2024   Mammogram  09/12/2025   Cervical Cancer Screening (HPV/Pap Cotest)  06/13/2026   Colonoscopy  06/05/2027   DTaP/Tdap/Td (3 - Td or Tdap) 06/02/2029   Pneumococcal Vaccine: 50+ Years  Completed   Influenza Vaccine  Completed   Hepatitis C Screening  Completed   HIV Screening  Completed   Zoster Vaccines- Shingrix   Completed   HPV VACCINES  Aged Out   Meningococcal B Vaccine  Aged Out    Discussed health benefits of physical activity, and encouraged her to engage in regular exercise appropriate for her age and condition.  Problem List Items Addressed This Visit   None Visit Diagnoses       Wellness examination    -  Primary   Relevant Orders   CMP14+EGFR   Lipid panel   CBC     Encounter for immunization       Relevant Orders   Pneumococcal conjugate vaccine 20-valent (Completed)       Assessment and Plan Assessment & Plan Adult Wellness Visit Routine wellness visit with up-to-date screenings. Discussed pneumonia and COVID vaccines. - Administered pneumonia vaccine. - Performed blood work. - Continue routine health maintenance screenings as scheduled.    Return in about 1 year (around 07/07/2025) for Wellness Exam.    Dorothyann Byars, MD [facility] at [facility]    "

## 2024-07-08 ENCOUNTER — Ambulatory Visit: Payer: Self-pay | Admitting: Family Medicine

## 2024-07-08 ENCOUNTER — Encounter: Payer: Self-pay | Admitting: Family Medicine

## 2024-07-08 LAB — CMP14+EGFR
ALT: 17 IU/L (ref 0–32)
AST: 21 IU/L (ref 0–40)
Albumin: 4.3 g/dL (ref 3.8–4.9)
Alkaline Phosphatase: 94 IU/L (ref 49–135)
BUN/Creatinine Ratio: 26 — ABNORMAL HIGH (ref 9–23)
BUN: 19 mg/dL (ref 6–24)
Bilirubin Total: 0.6 mg/dL (ref 0.0–1.2)
CO2: 25 mmol/L (ref 20–29)
Calcium: 9.5 mg/dL (ref 8.7–10.2)
Chloride: 102 mmol/L (ref 96–106)
Creatinine, Ser: 0.74 mg/dL (ref 0.57–1.00)
Globulin, Total: 2.1 g/dL (ref 1.5–4.5)
Glucose: 83 mg/dL (ref 70–99)
Potassium: 4.2 mmol/L (ref 3.5–5.2)
Sodium: 142 mmol/L (ref 134–144)
Total Protein: 6.4 g/dL (ref 6.0–8.5)
eGFR: 94 mL/min/1.73

## 2024-07-08 LAB — CBC
Hematocrit: 44.1 % (ref 34.0–46.6)
Hemoglobin: 14.2 g/dL (ref 11.1–15.9)
MCH: 30.7 pg (ref 26.6–33.0)
MCHC: 32.2 g/dL (ref 31.5–35.7)
MCV: 96 fL (ref 79–97)
Platelets: 319 x10E3/uL (ref 150–450)
RBC: 4.62 x10E6/uL (ref 3.77–5.28)
RDW: 13.2 % (ref 11.7–15.4)
WBC: 4.6 x10E3/uL (ref 3.4–10.8)

## 2024-07-08 LAB — LIPID PANEL
Chol/HDL Ratio: 2.5 ratio (ref 0.0–4.4)
Cholesterol, Total: 198 mg/dL (ref 100–199)
HDL: 80 mg/dL
LDL Chol Calc (NIH): 104 mg/dL — ABNORMAL HIGH (ref 0–99)
Triglycerides: 75 mg/dL (ref 0–149)
VLDL Cholesterol Cal: 14 mg/dL (ref 5–40)

## 2024-07-08 NOTE — Progress Notes (Signed)
 Hi Mackenize, metabolic panel overall looks good.  LDL cholesterol is up just slightly but it does look better this year compared to last year so great work in bringing that down.  Blood count looks great no sign of anemia

## 2024-07-09 LAB — SPECIMEN STATUS REPORT

## 2024-07-09 LAB — TSH: TSH: 2.27 u[IU]/mL (ref 0.450–4.500)

## 2024-07-09 LAB — VITAMIN D 25 HYDROXY (VIT D DEFICIENCY, FRACTURES): Vit D, 25-Hydroxy: 33.3 ng/mL (ref 30.0–100.0)

## 2024-07-11 NOTE — Progress Notes (Signed)
 HI Devanny, your vitamin D  is still on low end. Are you taking vitamin D ? how much?

## 2024-07-18 ENCOUNTER — Ambulatory Visit

## 2025-07-08 ENCOUNTER — Encounter: Admitting: Family Medicine
